# Patient Record
Sex: Male | Born: 1975 | Race: Black or African American | Hispanic: No | Marital: Single | Smoking: Current some day smoker
Health system: Southern US, Community
[De-identification: ages and names within clinical notes are randomized; demographics above are authoritative.]

## PROBLEM LIST (undated history)

## (undated) DIAGNOSIS — J45909 Unspecified asthma, uncomplicated: Secondary | ICD-10-CM

## (undated) DIAGNOSIS — M109 Gout, unspecified: Secondary | ICD-10-CM

## (undated) HISTORY — PX: NO PAST SURGERIES: SHX2092

---

## 2019-07-31 ENCOUNTER — Ambulatory Visit
Admission: EM | Admit: 2019-07-31 | Discharge: 2019-07-31 | Disposition: A | Discharge status: Home / Self Care | Payer: Self-pay | Attending: Family Medicine | Admitting: Family Medicine

## 2019-07-31 ENCOUNTER — Other Ambulatory Visit: Payer: Self-pay

## 2019-07-31 DIAGNOSIS — M25561 Pain in right knee: Secondary | ICD-10-CM

## 2019-07-31 HISTORY — DX: Unspecified asthma, uncomplicated: J45.909

## 2019-07-31 MED ORDER — PREDNISONE 10 MG PO TABS: ORAL_TABLET | ORAL | 0 refills | Status: DC

## 2019-07-31 MED ORDER — TRAMADOL HCL 50 MG PO TABS: 50.0000 mg | ORAL_TABLET | Freq: Three times a day (TID) | ORAL | 0 refills | Status: DC | PRN

## 2019-07-31 NOTE — Discharge Instructions (Signed)
Rest, ice, elevation.  Prednisone as prescribed.  Pain medication as needed.  Take care  Dr. Lacinda Axon

## 2019-07-31 NOTE — ED Triage Notes (Signed)
Patient complains of right knee pain that he thinks he might have "banged it" a few days. Patient states that the pain has increased as well as the swelling over last 2 days. Patient states that he has a history of gout.

## 2019-07-31 NOTE — ED Provider Notes (Signed)
[facility]    CSN: [id number] Arrival date & time: 07/31/19  1724  History   Chief Complaint Chief Complaint  Patient presents with  . Knee Pain    right   HPI  43 year old male presents with right knee pain.  2-day history of right knee pain.  He reports associated swelling.  He states that the swelling is quite extensive.  He states that his pain is 10/10 in severity.  He denies any recent fall, trauma, injury.  Patient reports that he has a history of gout.  He states that he has had gout in his knee before.  Pain is worse with activity and range of motion.  No relieving factors.  No known inciting factor.  No other reported symptoms.  No other complaints.  PMH, Surgical Hx, Family Hx, Social History reviewed and updated as below.  Past Medical History:  Diagnosis Date  . Asthma   Gout  Past Surgical History:  Procedure Laterality Date  . NO PAST SURGERIES     Home Medications    Prior to Admission medications   Medication Sig Start Date End Date Taking? Authorizing Provider  predniSONE (DELTASONE) 10 MG tablet 50 mg daily x 3 days, then 40 mg daily x 3 days, then 30 mg daily x 3 days, then 20 mg daily x 3 days, then 10 mg daily x 3 days. 07/31/19   Tommie Samsook, Verdie Wilms G, DO  traMADol (ULTRAM) 50 MG tablet Take 1 tablet (50 mg total) by mouth every 8 (eight) hours as needed. 07/31/19   Tommie Samsook, Skye Rodarte G, DO    Family History Family History  Problem Relation Age of Onset  . Diabetes Mother   . Heart disease Mother   . COPD Mother     Social History Social History   Tobacco Use  . Smoking status: Never Smoker  . Smokeless tobacco: Never Used  Substance Use Topics  . Alcohol use: Yes    Comment: rare  . Drug use: Never     Allergies   Patient has no known allergies.   Review of Systems Review of Systems  Constitutional: Negative.   Musculoskeletal:       Right knee pain, swelling.   Physical Exam Triage Vital Signs ED Triage Vitals  Enc Vitals  Group     BP 07/31/19 1756 111/85     Pulse Rate 07/31/19 1756 76     Resp 07/31/19 1756 16     Temp 07/31/19 1756 98.4 F (36.9 C)     Temp Source 07/31/19 1756 Oral     SpO2 07/31/19 1756 100 %     Weight 07/31/19 1753 210 lb (95.3 kg)     Height 07/31/19 1753 5\' 6"  (1.676 m)     Head Circumference --      Peak Flow --      Pain Score 07/31/19 1753 10     Pain Loc --      Pain Edu? --      Excl. in GC? --    Updated Vital Signs BP 111/85 (BP Location: Left Arm)   Pulse 76   Temp 98.4 F (36.9 C) (Oral)   Resp 16   Ht 5\' 6"  (1.676 m)   Wt 95.3 kg   SpO2 100%   BMI 33.89 kg/m   Visual Acuity Right Eye Distance:   Left Eye Distance:   Bilateral Distance:    Right Eye Near:   Left Eye Near:    Bilateral Near:  Physical Exam Vitals signs and nursing note reviewed.  Constitutional:      General: He is not in acute distress.    Appearance: Normal appearance. He is obese. He is not ill-appearing.  Eyes:     General:        Right eye: No discharge.        Left eye: No discharge.     Conjunctiva/sclera: Conjunctivae normal.  Cardiovascular:     Rate and Rhythm: Normal rate and regular rhythm.     Heart sounds: No murmur.  Pulmonary:     Effort: Pulmonary effort is normal.     Breath sounds: Normal breath sounds. No wheezing, rhonchi or rales.  Musculoskeletal:     Comments: Right knee: Effusion noted.  Diffuse, exquisite tenderness to palpation.  Mild warmth.  Neurological:     Mental Status: He is alert.  Psychiatric:        Mood and Affect: Mood normal.        Behavior: Behavior normal.    UC Treatments / Results  Labs (all labs ordered are listed, but only abnormal results are displayed) Labs Reviewed - No data to display  EKG   Radiology No results found.  Procedures Procedures (including critical care time)  Medications Ordered in UC Medications - No data to display  Initial Impression / Assessment and Plan / UC Course  I have reviewed  the triage vital signs and the nursing notes.  Pertinent labs & imaging results that were available during my care of the patient were reviewed by me and considered in my medical decision making (see chart for details).    43 year old male presents with acute right knee pain.  Suspect gout.  Treating with prednisone.  Tramadol as needed for pain.  Final Clinical Impressions(s) / UC Diagnoses   Final diagnoses:  Acute pain of right knee     Discharge Instructions     Rest, ice, elevation.  Prednisone as prescribed.  Pain medication as needed.  Take care  Dr. Lacinda Axon    ED Prescriptions    Medication Sig Dispense Auth. Provider   predniSONE (DELTASONE) 10 MG tablet 50 mg daily x 3 days, then 40 mg daily x 3 days, then 30 mg daily x 3 days, then 20 mg daily x 3 days, then 10 mg daily x 3 days. 45 tablet Tayjah Lobdell G, DO   traMADol (ULTRAM) 50 MG tablet Take 1 tablet (50 mg total) by mouth every 8 (eight) hours as needed. 10 tablet Coral Spikes, DO     Controlled Substance Prescriptions [place] Controlled Substance Registry consulted? Not Applicable   Coral Spikes, DO 07/31/19 2005

## 2019-12-05 ENCOUNTER — Ambulatory Visit (INDEPENDENT_AMBULATORY_CARE_PROVIDER_SITE_OTHER): Payer: Self-pay

## 2019-12-05 ENCOUNTER — Other Ambulatory Visit: Payer: Self-pay

## 2019-12-05 ENCOUNTER — Ambulatory Visit
Admission: EM | Admit: 2019-12-05 | Discharge: 2019-12-05 | Disposition: A | Discharge status: Home / Self Care | Payer: Self-pay | Attending: Urgent Care | Admitting: Urgent Care

## 2019-12-05 DIAGNOSIS — M79671 Pain in right foot: Secondary | ICD-10-CM

## 2019-12-05 DIAGNOSIS — L03115 Cellulitis of right lower limb: Secondary | ICD-10-CM

## 2019-12-05 DIAGNOSIS — M7989 Other specified soft tissue disorders: Secondary | ICD-10-CM

## 2019-12-05 HISTORY — DX: Gout, unspecified: M10.9

## 2019-12-05 MED ORDER — PREDNISONE 20 MG PO TABS: ORAL_TABLET | ORAL | 0 refills | Status: DC

## 2019-12-05 MED ORDER — DOXYCYCLINE HYCLATE 100 MG PO CAPS: 100.0000 mg | ORAL_CAPSULE | Freq: Two times a day (BID) | ORAL | 0 refills | Status: DC

## 2019-12-05 MED ORDER — TRAMADOL HCL 50 MG PO TABS: 50.0000 mg | ORAL_TABLET | Freq: Three times a day (TID) | ORAL | 0 refills | Status: DC | PRN

## 2019-12-05 NOTE — Discharge Instructions (Signed)
It was very nice seeing you today in clinic. Thank you for entrusting me with your care.   Rest, ice, and elevate your foot. Soak foot at least once a day in warm EPSON SALT water to help with swelling. May use ice if you feel like it helps. Monitor for signs and symptoms of worsening infection, which would include increased redness, swelling, streaking, drainage, pain, and the development of a fever. Take medications as prescribed. Call clinic if not improving.   Make arrangements to follow up with your regular doctor in 1 week for re-evaluation if not improving. If your symptoms/condition worsens, please seek follow up care either here or in the ER. Please remember, our [facility] providers are "right here with you" when you need [place].   Again, it was my pleasure to take care of you today. Thank you for choosing our clinic. I hope that you start to feel better quickly.   Quentin Mulling, MSN, APRN, FNP-C, CEN Advanced Practice Provider [facility]

## 2019-12-05 NOTE — ED Triage Notes (Addendum)
Pt presents with c/o edema to right foot. He states there is a red area on the outer area of his foot and he thinks he may have been bitten by an insect/spider. Pt states he noticed what could be a bite area about 3 days ago. The swelling has increased since then and pt is not able to ambulate on the foot. Area of interest is red and warm to the touch. Pt denies any fever/chills or n/v.

## 2019-12-06 ENCOUNTER — Encounter: Payer: Self-pay | Admitting: Urgent Care

## 2019-12-06 NOTE — ED Provider Notes (Signed)
[place]   Name: Nathaniel [name] DOB: 05-Aug-1976 MRN: [id number] CSN: [id number] PCP: Patient, No Pcp Per  Arrival date and time:  12/05/19 1606  Chief Complaint:  Foot Pain (right)   NOTE: Prior to seeing the patient today, I have reviewed the triage nursing documentation and vital signs. Clinical staff has updated patient's PMH/PSHx, current medication list, and drug allergies/intolerances to ensure comprehensive history available to assist in medical decision making.   History:   HPI: Dequann Vandervelden is a 44 y.o. male who presents today with complaints of pain and swelling to his RIGHT foot. Symptoms progressive over the course of the last 3 days. Pain overlying MTP joints of digits 3 through 5 and lateral aspect of foot. There is evidence of an insect bite to the lateral foot. Patient denies any drainage, fever, or chills. PMH (+) for gout and patient notes that this feels similar. With that being said, he is also concerned about a possible spider bite. Patient with ambulation difficulties. He states, "the swelling makes it feel like I am wake on Jello. When I put weight on it, the pain is so bad". Patient presents to clinic in a wheelchair. His mother drove patient to the clinic today. In efforts to conservatively manage his symptoms at home, the patient notes that he has used oral diphenhydramine, which has not helped to improve his symptoms.   Past Medical History:  Diagnosis Date  . Asthma   . Gout     Past Surgical History:  Procedure Laterality Date  . NO PAST SURGERIES      Family History  Problem Relation Age of Onset  . Diabetes Mother   . Heart disease Mother   . COPD Mother     Social History   Tobacco Use  . Smoking status: Never Smoker  . Smokeless tobacco: Never Used  Substance Use Topics  . Alcohol use: Yes    Comment: rare  . Drug use: Never    There are no problems to display for this patient.   Home Medications:    No outpatient  medications have been marked as taking for the 12/05/19 encounter [facility] Encounter).    Allergies:   Patient has no known allergies.  Review of Systems (ROS): Review of Systems  Constitutional: Negative for chills and fever.  Respiratory: Negative for cough and shortness of breath.   Cardiovascular: Negative for chest pain and palpitations.  Musculoskeletal: Positive for gait problem.       Acute pain and swelling to RIGHT foot.  Skin: Positive for color change. Negative for pallor, rash and wound.  Neurological: Negative for weakness and numbness.  All other systems reviewed and are negative.    Vital Signs: Today's Vitals   12/05/19 1700 12/05/19 1701 12/05/19 1703  BP:   100/65  Pulse:   83  Temp:   98.6 F (37 C)  TempSrc:   Oral  SpO2:   100%  Weight:  198 lb (89.8 kg)   Height:  5\' 6"  (1.676 m)   PainSc: 8       Physical Exam: Physical Exam  Constitutional: He is oriented to person, place, and time and well-developed, well-nourished, and in no distress.  HENT:  Head: Normocephalic and atraumatic.  Eyes: Pupils are equal, round, and reactive to light.  Cardiovascular: Normal rate, regular rhythm, normal heart sounds and intact distal pulses.  Pulmonary/Chest: Effort normal and breath sounds normal.  Musculoskeletal:     Right foot: Normal range  of motion. Swelling and tenderness present. No deformity or crepitus.       Feet:  Neurological: He is alert and oriented to person, place, and time. Gait normal.  Skin: Skin is warm and dry. No rash noted. He is not diaphoretic.  Psychiatric: Mood, memory, affect and judgment normal.  Nursing note and vitals reviewed.   Urgent Care Treatments / Results:   Orders Placed This Encounter  Procedures  . DG Foot Complete Right    LABS: PLEASE NOTE: all labs that were ordered this encounter are listed, however only abnormal results are displayed. Labs Reviewed - No data to display  EKG: -None  RADIOLOGY: DG  Foot Complete Right  Result Date: 12/05/2019 CLINICAL DATA:  Pain and swelling over the third through fifth metatarsal region of the foot. No trauma. Questionable insect bite or gout. EXAM: RIGHT FOOT COMPLETE - 3+ VIEW COMPARISON:  None. FINDINGS: No fracture or bone lesion. Joints are normally spaced and aligned.  No arthropathic changes. There is nonspecific dorsal forefoot soft tissue swelling. No radiopaque foreign body. No soft tissue air. IMPRESSION: 1. No fracture, bone lesion or joint abnormality. 2. Dorsal forefoot soft tissue swelling. Electronically Signed   By: Amie Portland M.D.   On: 12/05/2019 17:33    PROCEDURES: Procedures  MEDICATIONS RECEIVED THIS VISIT: Medications - No data to display  PERTINENT CLINICAL COURSE NOTES/UPDATES:   Initial Impression / Assessment and Plan / Urgent Care Course:  Pertinent labs & imaging results that were available during my care of the patient were personally reviewed by me and considered in my medical decision making (see lab/imaging section of note for values and interpretations).  Breyson Kelm is a 44 y.o. male who presents to [facility] today with complaints of Foot Pain (right)  Patient is well appearing overall in clinic today. He does not appear to be in any acute distress. Presenting symptoms (see HPI) and exam as documented above. Exam reveals pain, swelling, erythema, and warmth to the dorsal forefoot. Pain mainly overly 3rd - 5th MTP joints and lateral aspect of foot. There is a small puncture wound to the lateral aspect of the foot. Discussed differential diagnoses, which include insect/arthropod bite, penetrating wound from foreign body, and flare of patient's known gout. Discussed that gout less likely as it generally manifests in the great toe. Patient advises that this is similar to past presentations. Will obtain diagnostic plain films to assess for subcutaneous gas collection and intra/extra-articular crystals to  further assess.   Diagnostic radiographs of the RIGHT foot revealed no acute osseous abnormalities; no fracture or dislocation. No evidence of gas or crystals. (+) dorsal soft tissue swelling. Given his exam, concern for cellulitis discussed. Etiology of wound to lateral foot remains unknown. Bite vs. penetrating wound remains at the top of the DDx list. Will cover for infectious etiology using a 10 day course of doxycycline (100 mg BID). Patient to monitor for signs and symptoms of infection, which would include increased redness, swelling, streaking, drainage, pain, and the development of a fever.  Additionally, will send in a 5 day steroid burst (40 mg daily) to help with reduce inflammation. Patient encouraged to soak foot in warm Epson salt water. May apply ice as tolerated to help reduce swelling. May use Tylenol and/or Ibuprofen as needed for pain. Short course of Tramadol also sent in for severe pain; indications and side effects reviewed.   Discussed follow up with primary care physician in 1 week for re-evaluation. I  have reviewed the follow up and strict return precautions for any new or worsening symptoms. Patient is aware of symptoms that would be deemed urgent/emergent, and would thus require further evaluation either here or in the emergency department. At the time of discharge, he verbalized understanding and consent with the discharge plan as it was reviewed with him. All questions were fielded by provider and/or clinic staff prior to patient discharge.    Final Clinical Impressions / Urgent Care Diagnoses:   Final diagnoses:  Cellulitis of right foot  Foot pain, right  Foot swelling    New Prescriptions:  [place] Controlled Substance Registry consulted? Yes, I have consulted the [place] Controlled Substances Registry for this patient, and feel the risk/benefit ratio today is favorable for proceeding with this prescription for a controlled substance.  . Discussed use of controlled substance  medication to treat his acute pain.  o Reviewed [facility] STOP Act regulations  o Clinic does not refill controlled substances over the phone without face to face evaluation.  . Safety precautions reviewed.  o Medications should not be bitten, chewed, sold, or taken with alcohol.  o Avoid use while working, driving, or operating heavy machinery.  o Side effects associated with the use of this particular medication reviewed. - Patient understands that this medication can cause CNS depression, increase his risk of falls, and even lead to overdose that may result in death, if used outside of the parameters that he and I discussed.  With all of this in mind, he knowingly accepts the risks and responsibilities associated with intended course of treatment, and elects to responsibly proceed as discussed.  Meds ordered this encounter  Medications  . doxycycline (VIBRAMYCIN) 100 MG capsule    Sig: Take 1 capsule (100 mg total) by mouth 2 (two) times daily.    Dispense:  20 capsule    Refill:  0  . predniSONE (DELTASONE) 20 MG tablet    Sig: Take 40 mg (2 tabs) daily for next 5 days.    Dispense:  10 tablet    Refill:  0  . traMADol (ULTRAM) 50 MG tablet    Sig: Take 1 tablet (50 mg total) by mouth every 8 (eight) hours as needed.    Dispense:  12 tablet    Refill:  0    Recommended Follow up Care:  Patient encouraged to follow up with the following provider within the specified time frame, or sooner as dictated by the severity of his symptoms. As always, he was instructed that for any urgent/emergent care needs, he should seek care either here or in the emergency department for more immediate evaluation.  Follow-up Information    PCP In 1 week.   Why: General reassessment of symptoms if not improving        NOTE: This note was prepared using Lobbyist along with smaller Company secretary. Despite my best ability to proofread, there is the potential that transcriptional errors may  still occur from this process, and are completely unintentional.    Karen Kitchens, NP 12/06/19 2217

## 2020-02-20 ENCOUNTER — Other Ambulatory Visit
Admission: RE | Admit: 2020-02-20 | Discharge: 2020-02-20 | Disposition: A | Discharge status: Home / Self Care | Payer: Self-pay | Attending: Family Medicine | Admitting: Family Medicine

## 2020-02-20 ENCOUNTER — Emergency Department: Admission: EM | Admit: 2020-02-20 | Payer: Self-pay | Source: Home / Self Care

## 2020-02-20 NOTE — ED Triage Notes (Signed)
Pt arrived to Ed triage 3 accompanied by [employer] Officer Pride, for a forensic blood draw.  Warrant in place for blood draw.  Pt ambulated to triage 3, verbally consented to blood draw and voluntarily signed consent form.  Officers provided kit.  Tourniquit applied to left upper arm and site prepped with betadine swab provided in the forensic kit. 2 gray top tubes obtained, tournniquit removed, needle withdrawn intact.  Site dressed with sterile dressing and bleeding controlled.  Pt ambulated out of ED escorted by [employer].

## 2020-03-15 ENCOUNTER — Encounter: Payer: Self-pay | Admitting: Emergency Medicine

## 2020-03-15 ENCOUNTER — Ambulatory Visit (INDEPENDENT_AMBULATORY_CARE_PROVIDER_SITE_OTHER): Payer: Self-pay

## 2020-03-15 ENCOUNTER — Other Ambulatory Visit: Payer: Self-pay

## 2020-03-15 ENCOUNTER — Ambulatory Visit
Admission: EM | Admit: 2020-03-15 | Discharge: 2020-03-15 | Disposition: A | Discharge status: Home / Self Care | Payer: Self-pay | Attending: Emergency Medicine | Admitting: Emergency Medicine

## 2020-03-15 DIAGNOSIS — L03116 Cellulitis of left lower limb: Secondary | ICD-10-CM

## 2020-03-15 LAB — BASIC METABOLIC PANEL
Anion gap: 8 (ref 5–15)
BUN: 14 mg/dL (ref 6–20)
CO2: 26 mmol/L (ref 22–32)
Calcium: 8.5 mg/dL — ABNORMAL LOW (ref 8.9–10.3)
Chloride: 99 mmol/L (ref 98–111)
Creatinine, Ser: 0.84 mg/dL (ref 0.61–1.24)
GFR calc Af Amer: >60 mL/min (ref >60)
GFR calc non Af Amer: >60 mL/min (ref >60)
Glucose, Bld: 93 mg/dL (ref 70–99)
Potassium: 4.4 mmol/L (ref 3.5–5.1)
Sodium: 133 mmol/L — ABNORMAL LOW (ref 135–145)

## 2020-03-15 LAB — CBC WITH DIFFERENTIAL/PLATELET
Abs Immature Granulocytes: 0.01 10*3/uL (ref 0.00–0.07)
Basophils Absolute: 0.1 10*3/uL (ref 0.0–0.1)
Basophils Relative: 1 %
Eosinophils Absolute: 0.3 10*3/uL (ref 0.0–0.5)
Eosinophils Relative: 4 %
HCT: 37.1 % — ABNORMAL LOW (ref 39.0–52.0)
Hemoglobin: 11.8 g/dL — ABNORMAL LOW (ref 13.0–17.0)
Immature Granulocytes: 0 %
Lymphocytes Relative: 25 %
Lymphs Abs: 1.9 10*3/uL (ref 0.7–4.0)
MCH: 28.2 pg (ref 26.0–34.0)
MCHC: 31.8 g/dL (ref 30.0–36.0)
MCV: 88.5 fL (ref 80.0–100.0)
Monocytes Absolute: 0.7 10*3/uL (ref 0.1–1.0)
Monocytes Relative: 9 %
Neutro Abs: 4.6 10*3/uL (ref 1.7–7.7)
Neutrophils Relative %: 61 %
Platelets: 276 10*3/uL (ref 150–400)
RBC: 4.19 MIL/uL — ABNORMAL LOW (ref 4.22–5.81)
RDW: 14.2 % (ref 11.5–15.5)
WBC: 7.5 10*3/uL (ref 4.0–10.5)
nRBC: 0 % (ref 0.0–0.2)

## 2020-03-15 MED ORDER — DOXYCYCLINE HYCLATE 100 MG PO CAPS: 100.0000 mg | ORAL_CAPSULE | Freq: Two times a day (BID) | ORAL | 0 refills | Status: AC

## 2020-03-15 MED ORDER — IBUPROFEN 600 MG PO TABS: 600.0000 mg | ORAL_TABLET | Freq: Four times a day (QID) | ORAL | 0 refills | Status: DC | PRN

## 2020-03-15 MED ORDER — CEFTRIAXONE SODIUM 1 G IJ SOLR
1.0000 g | Freq: Once | INTRAMUSCULAR | Status: AC
  Administered 2020-03-15: 10:00:00 1 g via INTRAMUSCULAR

## 2020-03-15 NOTE — ED Triage Notes (Signed)
Pt c/o left leg and foot pain and swelling. Started about 2 days ago. He has redness on the top of his foot. He states the swelling is moving upward towards his knee. He also states that his right foot started yesterday.

## 2020-03-15 NOTE — Discharge Instructions (Addendum)
I have given you a shot of antibiotics today.  Finish the doxycycline, even if you feel better.  You may take ibuprofen 600 mg combined with the 1000 mg of Tylenol 3-4 times a day as needed for pain.  Your x-rays negative for fracture, there is no air underneath the skin.  Return here in 2 to 3 days if you are not getting any better.  Go immediately to the ER for fevers, body aches, if the redness spreads beyond the marks that we made today, if you are unable to move your ankle at all.  We then have to worry about an ankle joint infection, which an emergency.  Here is a list of primary care providers who are taking new patients:  Dr. Elizabeth Sauer, Dr. Schuyler Amor [address] [place] [address] [phone]  [facility] [address] [place] [address]  [phone]  [facility] [address] [place], [place] [address] [phone]  [facility] [address] [place]  [phone] [place], [place] [address]  Here are clinics/ other resources who will see you if you do not have insurance. Some have certain criteria that you must meet. Call them and find out what they are:  [facility]: [address], [place], [place] [address] Phone: [phone] Hours: First and Third Saturdays of each Month, 9 a.m. - 1 p.m.  [facility]: [address], [address] [place], [place] [address] Phone: [phone] Hours: Tuesday, 4 p.m. - 8 p.m. Thursday, 1 p.m. - 8 p.m. Wednesday, 9 a.m. - [facility] [address], [place], [place] [address] Phone: [phone] Pharmacy Phone Number: [phone] Dental Phone Number: [phone] [facility] Insurance Help: [phone]  Dental Hours: Monday - Thursday, 8 a.m. - 6 p.m.  [facility] [address], [place], [place] [address] Phone: [phone] Pharmacy Phone Number: [phone] [facility] Insurance Help: [phone]  [facility] [address] [place] [place], [place] [address] Phone: [phone] Pharmacy Phone Number: [phone] [facility] Insurance Help: [phone]  [facility] [address] [place], [place] [address] Phone: [phone] [facility] Insurance Help: [phone]   [facility] [address], [place], [place] [address] Phone: [phone]  Go to www.goodrx.com to look up your medications. This will give you a list of where you can find your prescriptions at the most affordable prices. Or ask the pharmacist what the cash price is, or if they have any other discount programs available to help make your medication more affordable. This can be less expensive than what you would pay with insurance.

## 2020-03-15 NOTE — ED Provider Notes (Signed)
HPI  SUBJECTIVE:  Nathaniel [name] is a 44 y.o. male who presents with left foot and ankle pain, swelling starting 3 days ago.  Reports swelling extending into his distal lower extremity.  He describes the pain as achy, sharp, present with weightbearing.  He reports erythema, increased temperature along his lateral left foot.  He denies change in his physical activity although he stands on his feet all day.  No trauma to the foot.  No body aches, fevers.  He reports increased temperature in the area of erythema, tingling.  No numbness.  No rash, leg pain.  It started distally and is spreading proximally.  He denies preceding paresthesias.  He tried ice, elevation, ibuprofen 200 mg every several hours without improvement of symptoms.  Symptoms are worse with holding his foot in a dependent position.  He has had similar symptoms before, was found to have a cellulitis, was sent home with doxycycline which helped.  He has a past medical history of gout and chickenpox.  No history of chronic kidney disease, diabetes, peripheral neuropathy, shingles, hypertension, peripheral arterial disease, peripheral vascular disease, MRSA.  PMD: None.  Past Medical History:  Diagnosis Date  . Asthma   . Gout     Past Surgical History:  Procedure Laterality Date  . NO PAST SURGERIES      Family History  Problem Relation Age of Onset  . Diabetes Mother   . Heart disease Mother   . COPD Mother     Social History   Tobacco Use  . Smoking status: Never Smoker  . Smokeless tobacco: Never Used  Substance Use Topics  . Alcohol use: Yes    Comment: rare  . Drug use: Never    No current facility-administered medications for this encounter.  Current Outpatient Medications:  .  albuterol (VENTOLIN HFA) 108 (90 Base) MCG/ACT inhaler, Inhale into the lungs every 6 (six) hours as needed for wheezing or shortness of breath., Disp: , Rfl:  .  doxycycline (VIBRAMYCIN) 100 MG capsule, Take 1 capsule (100 mg total)  by mouth 2 (two) times daily., Disp: 20 capsule, Rfl: 0 .  predniSONE (DELTASONE) 20 MG tablet, Take 40 mg (2 tabs) daily for next 5 days., Disp: 8 tablet, Rfl: 0 .  traMADol (ULTRAM) 50 MG tablet, Take 1 tablet (50 mg total) by mouth every 8 (eight) hours as needed., Disp: 12 tablet, Rfl: 0  No Known Allergies   ROS  As noted in HPI.   Physical Exam  BP 114/75 (BP Location: Left Arm)   Pulse 84   Temp 98.2 F (36.8 C) (Oral)   Resp 18   Ht 5\' 6"  (1.676 m)   Wt 89.8 kg   SpO2 100%   BMI 31.95 kg/m   Constitutional: Well developed, well nourished, no acute distress Eyes:  EOMI, conjunctiva normal bilaterally HENT: Normocephalic, atraumatic,mucus membranes moist Respiratory: Normal inspiratory effort Cardiovascular: Normal rate GI: nondistended skin: No rash, skin intact Musculoskeletal: Positive swelling, tenderness dorsum of left foot.  15 x 9 cm area of tender erythema with increased temperature and lateral left foot.  Skin intact.  Pain with range of motion of the ankle.  DP 2+.  Sensation intact. limitation of motion of the little toe.  Positive edema to distal lower extremity.  No tenderness over the ankle.              Neurologic: Alert & oriented x 3, no focal neuro deficits Psychiatric: Speech and behavior appropriate  ED Course  Medications  cefTRIAXone (ROCEPHIN) injection 1 g (1 g Intramuscular Given 03/15/20 0938)    Orders Placed This Encounter  Procedures  . DG Foot Complete Left    Standing Status:   Standing    Number of Occurrences:   1    Order Specific Question:   Reason for Exam (SYMPTOM  OR DIAGNOSIS REQUIRED)    Answer:   redness pain lateral foot r/o fx, subcu air  . CBC with Differential    Standing Status:   Standing    Number of Occurrences:   1  . Basic metabolic panel    Standing Status:   Standing    Number of Occurrences:   1    Results for orders placed or performed during the hospital encounter of 03/15/20 (from the past  24 hour(s))  CBC with Differential     Status: Abnormal   Collection Time: 03/15/20  9:41 AM  Result Value Ref Range   WBC 7.5 4.0 - 10.5 K/uL   RBC 4.19 (L) 4.22 - 5.81 MIL/uL   Hemoglobin 11.8 (L) 13.0 - 17.0 g/dL   HCT 57.3 (L) 22.0 - 25.4 %   MCV 88.5 80.0 - 100.0 fL   MCH 28.2 26.0 - 34.0 pg   MCHC 31.8 30.0 - 36.0 g/dL   RDW 27.0 62.3 - 76.2 %   Platelets 276 150 - 400 K/uL   nRBC 0.0 0.0 - 0.2 %   Neutrophils Relative % 61 %   Neutro Abs 4.6 1.7 - 7.7 K/uL   Lymphocytes Relative 25 %   Lymphs Abs 1.9 0.7 - 4.0 K/uL   Monocytes Relative 9 %   Monocytes Absolute 0.7 0.1 - 1.0 K/uL   Eosinophils Relative 4 %   Eosinophils Absolute 0.3 0.0 - 0.5 K/uL   Basophils Relative 1 %   Basophils Absolute 0.1 0.0 - 0.1 K/uL   Immature Granulocytes 0 %   Abs Immature Granulocytes 0.01 0.00 - 0.07 K/uL  Basic metabolic panel     Status: Abnormal   Collection Time: 03/15/20  9:41 AM  Result Value Ref Range   Sodium 133 (L) 135 - 145 mmol/L   Potassium 4.4 3.5 - 5.1 mmol/L   Chloride 99 98 - 111 mmol/L   CO2 26 22 - 32 mmol/L   Glucose, Bld 93 70 - 99 mg/dL   BUN 14 6 - 20 mg/dL   Creatinine, Ser 8.31 0.61 - 1.24 mg/dL   Calcium 8.5 (L) 8.9 - 10.3 mg/dL   GFR calc non Af Amer >60 >60 mL/min   GFR calc Af Amer >60 >60 mL/min   Anion gap 8 5 - 15   DG Foot Complete Left  Result Date: 03/15/2020 CLINICAL DATA:  Redness at lateral RIGHT foot extending dorsally for 2 days, pain, question gout EXAM: LEFT FOOT - COMPLETE 3+ VIEW COMPARISON:  None FINDINGS: Soft tissue swelling LEFT foot. Osseous mineralization normal. Joint spaces preserved. No fracture, dislocation, or bone destruction. No soft tissue gas or radiopaque foreign bodies. IMPRESSION: No acute osseous abnormalities. Soft tissue swelling LEFT foot. Electronically Signed   By: Ulyses Southward M.D.   On: 03/15/2020 09:52    ED Clinical Impression  1. Cellulitis of left lower extremity      ED Assessment/Plan  Presentation  consistent with a cellulitis.  Will x-ray foot to rule out subcutaneous air.  Giving him a gram of Rocephin to initiate antibiotic coverage.  Septic joint in the differential, however, he is able to  move his ankle and the area of erythema is well demarcated on his foot.  Marked area of erythema with marker for reference.  CBC, BMP, x-ray.  Reviewed imaging independently.  No fracture.  Positive soft tissue swelling.  No subcu air.  See radiology report for full details.  BMP unremarkable.  Normal kidney function.  No leukocytosis.  X-ray is negative for fracture or for subcutaneous air.  Will treat as a cellulitis with doxycycline 100 mg twice daily for 7 days, ibuprofen/Tylenol 3-4 times a day as needed for pain.  Will have him follow-up here in 2 to 3 days if not getting any better.  Strict ER return precautions.  Primary care list for ongoing care.  Discussed possibility of going to the emergency department with patient, patient wants to try outpatient treatment first.  He is to go immediately to the ER for body aches, fevers, or if the erythema extends out past the marks that we made today, if he is unable to move his ankle.  Discussed with him that this is a septic joint and that he will need to go to the hospital for this.  Discussed labs, imaging, MDM, treatment plan, and plan for follow-up with patient. Discussed sn/sx that should prompt return to the ED. patient agrees with plan.   Meds ordered this encounter  Medications  . cefTRIAXone (ROCEPHIN) injection 1 g    *This clinic note was created using Scientist, clinical (histocompatibility and immunogenetics). Therefore, there may be occasional mistakes despite careful proofreading.   ?   Domenick Gong, MD 03/15/20 1254

## 2020-03-17 ENCOUNTER — Ambulatory Visit
Admission: EM | Admit: 2020-03-17 | Discharge: 2020-03-17 | Disposition: A | Discharge status: Home / Self Care | Payer: Self-pay | Attending: Family Medicine | Admitting: Family Medicine

## 2020-03-17 ENCOUNTER — Other Ambulatory Visit: Payer: Self-pay

## 2020-03-17 DIAGNOSIS — M109 Gout, unspecified: Secondary | ICD-10-CM

## 2020-03-17 MED ORDER — PREDNISONE 10 MG PO TABS: ORAL_TABLET | ORAL | 0 refills | Status: DC

## 2020-03-17 MED ORDER — METHYLPREDNISOLONE SODIUM SUCC 40 MG IJ SOLR
80.0000 mg | Freq: Once | INTRAMUSCULAR | Status: AC
  Administered 2020-03-17: 10:00:00 80 mg via INTRAMUSCULAR

## 2020-03-17 NOTE — Discharge Instructions (Addendum)
Continue the antibiotic to ensure there is no infection.  Start the prednisone tomorrow.  Take care  Dr. Adriana Simas

## 2020-03-17 NOTE — ED Provider Notes (Signed)
[facility]    CSN: [id number] Arrival date & time: 03/17/20  0854   History   Chief Complaint Chief Complaint  Patient presents with  . Leg Pain   HPI  44 year old male presents with the above complaint.  Patient seen on 5/2.  X-ray obtained and patient was diagnosed with cellulitis.  Placed on doxycycline after he was given Rocephin injection here in the clinic.  Patient presents with worsening pain of his left lower extremity.  He also reports knee pain and swelling now.  He reports that he is having difficulty ambulating.  Patient has had little to no improvement.  He rates his pain as 10/10 in severity.  Has a history of gout.  No other reported symptoms.  No other complaints.  Past Medical History:  Diagnosis Date  . Asthma   . Gout    Past Surgical History:  Procedure Laterality Date  . NO PAST SURGERIES     Home Medications    Prior to Admission medications   Medication Sig Start Date End Date Taking? Authorizing Provider  albuterol (VENTOLIN HFA) 108 (90 Base) MCG/ACT inhaler Inhale into the lungs every 6 (six) hours as needed for wheezing or shortness of breath.   Yes [provider]  doxycycline (VIBRAMYCIN) 100 MG capsule Take 1 capsule (100 mg total) by mouth 2 (two) times daily for 7 days. 03/15/20 03/22/20 Yes Melynda Ripple, MD  ibuprofen (ADVIL) 600 MG tablet Take 1 tablet (600 mg total) by mouth every 6 (six) hours as needed. 03/15/20  Yes Melynda Ripple, MD  predniSONE (DELTASONE) 10 MG tablet 50 mg daily x 3 days, then 40 mg daily x 3 days, then 30 mg daily x 3 days, then 20 mg daily x 3 days, then 10 mg daily x 3 days. 03/17/20   Coral Spikes, DO    Family History Family History  Problem Relation Age of Onset  . Diabetes Mother   . Heart disease Mother   . COPD Mother     Social History Social History   Tobacco Use  . Smoking status: Never Smoker  . Smokeless tobacco: Never Used  Substance Use Topics  . Alcohol use: Yes   Comment: rare  . Drug use: Never     Allergies   Patient has no known allergies.   Review of Systems Review of Systems  Musculoskeletal:       Left foot swelling, redness, pain.  Left knee pain.   Physical Exam Triage Vital Signs ED Triage Vitals  Enc Vitals Group     BP 03/17/20 0914 (!) 135/99     Pulse Rate 03/17/20 0914 93     Resp 03/17/20 0914 18     Temp 03/17/20 0914 98.5 F (36.9 C)     Temp Source 03/17/20 0914 Oral     SpO2 03/17/20 0914 100 %     Weight 03/17/20 0913 196 lb 3.4 oz (89 kg)     Height 03/17/20 0913 5\' 6"  (1.676 m)     Head Circumference --      Peak Flow --      Pain Score 03/17/20 0913 10     Pain Loc --      Pain Edu? --      Excl. in [place]? --    Updated Vital Signs BP (!) 135/99 (BP Location: Left Arm)   Pulse 93   Temp 98.5 F (36.9 C) (Oral)   Resp 18   Ht 5'  6" (1.676 m)   Wt 89 kg   SpO2 100%   BMI 31.67 kg/m   Visual Acuity Right Eye Distance:   Left Eye Distance:   Bilateral Distance:    Right Eye Near:   Left Eye Near:    Bilateral Near:     Physical Exam Vitals and nursing note reviewed.  Constitutional:      General: He is not in acute distress.    Appearance: Normal appearance. He is not ill-appearing.  HENT:     Head: Normocephalic and atraumatic.  Eyes:     General:        Right eye: No discharge.        Left eye: No discharge.     Conjunctiva/sclera: Conjunctivae normal.  Pulmonary:     Effort: Pulmonary effort is normal. No respiratory distress.  Musculoskeletal:     Comments: Left foot - lateral swelling, erythema, and severe tenderness to palpation.  Left knee - posterior tenderness to palpation.  Neurological:     Mental Status: He is alert.  Psychiatric:        Mood and Affect: Mood normal.        Behavior: Behavior normal.    UC Treatments / Results  Labs (all labs ordered are listed, but only abnormal results are displayed) Labs Reviewed - No data to display  EKG   Radiology No  results found.  Procedures Procedures (including critical care time)  Medications Ordered in UC Medications  methylPREDNISolone sodium succinate (SOLU-MEDROL) 40 mg/mL injection 80 mg (80 mg Intramuscular Given 03/17/20 0953)    Initial Impression / Assessment and Plan / UC Course  I have reviewed the triage vital signs and the nursing notes.  Pertinent labs & imaging results that were available during my care of the patient were reviewed by me and considered in my medical decision making (see chart for details).    44 year old male presents with suspected gout.  Placing on prednisone.  Solu-Medrol given here.  Final Clinical Impressions(s) / UC Diagnoses   Final diagnoses:  Acute gout of multiple sites, unspecified cause     Discharge Instructions     Continue the antibiotic to ensure there is no infection.  Start the prednisone tomorrow.  Take care  Dr. Adriana Simas    ED Prescriptions    Medication Sig Dispense Auth. Provider   predniSONE (DELTASONE) 10 MG tablet 50 mg daily x 3 days, then 40 mg daily x 3 days, then 30 mg daily x 3 days, then 20 mg daily x 3 days, then 10 mg daily x 3 days. 45 tablet Tommie Sams, DO     PDMP not reviewed this encounter.   Tommie Sams, [place] 03/17/20 1039

## 2020-03-17 NOTE — ED Triage Notes (Signed)
Patient complains of left leg pain with knee swelling and foot swelling. Patient states that he was seen here on Sunday and some foot swelling has improved but he feels like the swelling is moving up his leg. Patient was placed on Doxycycline and ibuprofen. Patient states that he is just not sure why he isn't better.

## 2020-07-02 ENCOUNTER — Other Ambulatory Visit: Payer: Self-pay

## 2020-07-02 ENCOUNTER — Ambulatory Visit
Admission: EM | Admit: 2020-07-02 | Discharge: 2020-07-02 | Disposition: A | Discharge status: Home / Self Care | Payer: Self-pay | Attending: Physician Assistant | Admitting: Physician Assistant

## 2020-07-02 ENCOUNTER — Encounter: Payer: Self-pay | Admitting: Emergency Medicine

## 2020-07-02 DIAGNOSIS — M25561 Pain in right knee: Secondary | ICD-10-CM

## 2020-07-02 DIAGNOSIS — M109 Gout, unspecified: Secondary | ICD-10-CM

## 2020-07-02 MED ORDER — PREDNISONE 10 MG PO TABS: ORAL_TABLET | ORAL | 0 refills | Status: DC

## 2020-07-02 NOTE — Discharge Instructions (Signed)
-  Begin prednisone. Tylenol for pain relief. Rest, ice, elevate, compression/brace -Check with your insurance to find a PCP. You will need labs done and then hopefully you can get on a medication to prevent all of these flare ups. -F/u with our clinic as needed for new/worsening symptoms

## 2020-07-02 NOTE — ED Provider Notes (Signed)
[facility]    CSN: [id number] Arrival date & time: 07/02/20  0805      History   Chief Complaint Chief Complaint  Patient presents with  . Knee Pain  . Joint Swelling    HPI Nathaniel [name] is a 44 y.o. male.   44 y/o male presents for right knee pain. Denies known injury. Also admits to swelling. He says he noticed the pain and swelling starting yesterday. He has pain of the "lower part of the knee." He says pain is increased with flexion and weightbearing. He has severe pain in those cases. He says pain is improved with extension and rest. Has tried taking Motrin w/o relief. Patient says he has a history of gout. He presented to [facility] 3 months ago for a similar problem affecting the left ankle. He was given injection of decadron and prescribed prednisone. He says those medications provided a lot of relief for him. He says he had been treated for soft tissue infection in May with antibiotics but did not have improvement in condition until he started prednisone. Patient denies fever, fatigue, body aches, n/v, numbness/tingling/weakness. Patient denies history of DVT. Denies lower leg/calf swelling or pain. Patient does not have a PCP. He has no other concerns today.     Past Medical History:  Diagnosis Date  . Asthma   . Gout     There are no problems to display for this patient.   Past Surgical History:  Procedure Laterality Date  . NO PAST SURGERIES         Home Medications    Prior to Admission medications   Medication Sig Start Date End Date Taking? Authorizing Provider  albuterol (VENTOLIN HFA) 108 (90 Base) MCG/ACT inhaler Inhale into the lungs every 6 (six) hours as needed for wheezing or shortness of breath.    [provider]  ibuprofen (ADVIL) 600 MG tablet Take 1 tablet (600 mg total) by mouth every 6 (six) hours as needed. 03/15/20   Domenick Gong, MD  predniSONE (DELTASONE) 10 MG tablet Take 5 tabs PO x 3 days, 4 tabs  x 3 days, 3 tabs x 2 days, 2 tabs x 2 days, 1 tab x 2 days 07/02/20   Gareth Morgan    Family History Family History  Problem Relation Age of Onset  . Diabetes Mother   . Heart disease Mother   . COPD Mother     Social History Social History   Tobacco Use  . Smoking status: Never Smoker  . Smokeless tobacco: Never Used  Vaping Use  . Vaping Use: Never used  Substance Use Topics  . Alcohol use: Yes    Comment: rare  . Drug use: Never     Allergies   Patient has no known allergies.   Review of Systems Review of Systems  Constitutional: Negative for chills, fatigue and fever.  Respiratory: Negative for cough and shortness of breath.   Cardiovascular: Positive for leg swelling (knee swelling). Negative for chest pain.  Gastrointestinal: Negative for abdominal pain, nausea and vomiting.  Musculoskeletal: Positive for arthralgias, gait problem and joint swelling. Negative for back pain and myalgias.  Skin: Positive for color change. Negative for rash.  All other systems reviewed and are negative.    Physical Exam Triage Vital Signs ED Triage Vitals  Enc Vitals Group     BP 07/02/20 0827 133/84     Pulse Rate 07/02/20 0827 100     Resp 07/02/20  0827 18     Temp 07/02/20 0827 98.9 F (37.2 C)     Temp Source 07/02/20 0827 Oral     SpO2 07/02/20 0827 100 %     Weight 07/02/20 0826 196 lb 3.4 oz (89 kg)     Height 07/02/20 0826 5\' 6"  (1.676 m)     Head Circumference --      Peak Flow --      Pain Score 07/02/20 0825 10     Pain Loc --      Pain Edu? --      Excl. in [place]? --    No data found.  Updated Vital Signs BP 133/84 (BP Location: Right Arm)   Pulse 100   Temp 98.9 F (37.2 C) (Oral)   Resp 18   Ht 5\' 6"  (1.676 m)   Wt 196 lb 3.4 oz (89 kg)   SpO2 100%   BMI 31.67 kg/m        Physical Exam Vitals and nursing note reviewed.  Constitutional:      General: He is not in acute distress.    Appearance: Normal appearance. He is  well-developed. He is not ill-appearing or toxic-appearing.  HENT:     Head: Normocephalic and atraumatic.  Eyes:     General: No scleral icterus.    Extraocular Movements: Extraocular movements intact.     Conjunctiva/sclera: Conjunctivae normal.  Cardiovascular:     Rate and Rhythm: Normal rate and regular rhythm.     Pulses: Normal pulses.     Heart sounds: Normal heart sounds. No murmur heard.   Pulmonary:     Effort: Pulmonary effort is normal. No respiratory distress.     Breath sounds: Normal breath sounds.  Abdominal:     Tenderness: There is no abdominal tenderness.  Musculoskeletal:        General: No swelling, deformity or signs of injury.     Cervical back: Normal range of motion and neck supple.     Right knee: Swelling (moderate diffuse swelling anterior knee) and erythema (very faint erythema distal anterior knee) present. No bony tenderness. Decreased range of motion (decreased flexion beyond 90 degrees due to pain/swelling, full extension). Tenderness present over the medial joint line and lateral joint line.     Left knee: Normal. No bony tenderness.     Comments: 5/5 strength bilat knee flexion and extension.  Skin:    General: Skin is warm and dry.     Findings: No rash.  Neurological:     General: No focal deficit present.     Mental Status: He is alert. Mental status is at baseline.     Motor: No weakness.     Coordination: Coordination normal.     Gait: Gait abnormal.  Psychiatric:        Mood and Affect: Mood normal.        Behavior: Behavior normal.        Thought Content: Thought content normal.      UC Treatments / Results  Labs (all labs ordered are listed, but only abnormal results are displayed) Labs Reviewed - No data to display  EKG   Radiology No results found.  Procedures Procedures (including critical care time)  Medications Ordered in UC Medications - No data to display  Initial Impression / Assessment and Plan / UC Course    I have reviewed the triage vital signs and the nursing notes.  Pertinent labs & imaging results that were available during my  care of the patient were reviewed by me and considered in my medical decision making (see chart for details).   -Symptoms consistent with another acute gout flare up. Starting patient on prednisone. Reviewed RICE guidelines. Advised trying to find PCP to get on meds to help prevent these flare ups.  F/u for fever, increased redness/swelling/pain or any new/worsening symptoms. Pt agreeable.  Final Clinical Impressions(s) / UC Diagnoses   Final diagnoses:  Acute gout of right knee, unspecified cause  Acute pain of right knee     Discharge Instructions     -Begin prednisone. Tylenol for pain relief. Rest, ice, elevate, compression/brace -Check with your insurance to find a PCP. You will need labs done and then hopefully you can get on a medication to prevent all of these flare ups. -F/u with our clinic as needed for new/worsening symptoms    ED Prescriptions    Medication Sig Dispense Auth. Provider   predniSONE (DELTASONE) 10 MG tablet Take 5 tabs PO x 3 days, 4 tabs x 3 days, 3 tabs x 2 days, 2 tabs x 2 days, 1 tab x 2 days 39 tablet Shirlee Latch, PA-C     PDMP not reviewed this encounter.   Shirlee Latch, PA-C 07/02/20 [phone]

## 2020-07-02 NOTE — ED Triage Notes (Signed)
Patient c/o right knee pain and swelling that started yesterday. He denies injury.

## 2020-08-23 ENCOUNTER — Other Ambulatory Visit: Payer: Self-pay

## 2020-08-23 ENCOUNTER — Ambulatory Visit
Admission: EM | Admit: 2020-08-23 | Discharge: 2020-08-23 | Disposition: A | Discharge status: Home / Self Care | Payer: BC Managed Care – PPO | Attending: Emergency Medicine | Admitting: Emergency Medicine

## 2020-08-23 DIAGNOSIS — G8929 Other chronic pain: Secondary | ICD-10-CM

## 2020-08-23 DIAGNOSIS — M25561 Pain in right knee: Secondary | ICD-10-CM

## 2020-08-23 DIAGNOSIS — T23112A Burn of first degree of left thumb (nail), initial encounter: Secondary | ICD-10-CM

## 2020-08-23 DIAGNOSIS — M25562 Pain in left knee: Secondary | ICD-10-CM

## 2020-08-23 MED ORDER — MELOXICAM 7.5 MG PO TABS: 7.5000 mg | ORAL_TABLET | Freq: Every day | ORAL | 0 refills | Status: DC

## 2020-08-23 NOTE — ED Triage Notes (Signed)
Patient complains of left thumb pain that started 3 days ago after touching a hot skillet. States that he cannot lift with this hand.  Patient states that he has bilateral knee pain that is in the back of his knee that is worse with bending. States that this started over the last week. States that the pain is a tightness.

## 2020-08-23 NOTE — Discharge Instructions (Signed)
Keep your thumb wrapped with gauze and out of the air to ease the discomfort.  Take the Meloxicam for your thumb and knee pain daily.  Stop the Ibuprofen.  Continue to wear your braces.  Elevate your knees as much as possible.

## 2020-08-23 NOTE — ED Provider Notes (Signed)
[facility]    CSN: [id number] Arrival date & time: 08/23/20  1118      History   Chief Complaint Chief Complaint  Patient presents with   Hand Pain    left thumb    HPI Nathaniel [name] is a 44 y.o. male.   44 yo male here for evaluation of a burn to his left thumb x 3 days and pain in the back of both knees that he has been dealing with for years. The pain became worse last weekend and increases with bending.     Past Medical History:  Diagnosis Date   Asthma    Gout     There are no problems to display for this patient.   Past Surgical History:  Procedure Laterality Date   NO PAST SURGERIES         Home Medications    Prior to Admission medications   Medication Sig Start Date End Date Taking? Authorizing Provider  albuterol (VENTOLIN HFA) 108 (90 Base) MCG/ACT inhaler Inhale into the lungs every 6 (six) hours as needed for wheezing or shortness of breath.   Yes [provider]  ibuprofen (ADVIL) 600 MG tablet Take 1 tablet (600 mg total) by mouth every 6 (six) hours as needed. 03/15/20  Yes Domenick Gong, MD  meloxicam (MOBIC) 7.5 MG tablet Take 1 tablet (7.5 mg total) by mouth daily. 08/23/20   Becky Augusta, NP    Family History Family History  Problem Relation Age of Onset   Diabetes Mother    Heart disease Mother    COPD Mother     Social History Social History   Tobacco Use   Smoking status: Never Smoker   Smokeless tobacco: Never Used  Building services engineer Use: Never used  Substance Use Topics   Alcohol use: Yes    Comment: rare   Drug use: Never     Allergies   Patient has no known allergies.   Review of Systems Review of Systems  Constitutional: Negative for activity change, appetite change and fever.  HENT: Negative for congestion and rhinorrhea.   Respiratory: Negative for cough.   Cardiovascular: Negative for chest pain.  Gastrointestinal: Negative for vomiting.  Musculoskeletal:  Positive for arthralgias. Negative for joint swelling and myalgias.  Skin: Negative.   Neurological: Negative for weakness and numbness.  Hematological: Negative.   Psychiatric/Behavioral: Negative.      Physical Exam Triage Vital Signs ED Triage Vitals  Enc Vitals Group     BP 08/23/20 1135 (!) 131/93     Pulse Rate 08/23/20 1135 78     Resp 08/23/20 1135 14     Temp 08/23/20 1135 98.9 F (37.2 C)     Temp Source 08/23/20 1135 Oral     SpO2 08/23/20 1135 100 %     Weight 08/23/20 1134 194 lb (88 kg)     Height 08/23/20 1134 5\' 6"  (1.676 m)     Head Circumference --      Peak Flow --      Pain Score 08/23/20 1134 5     Pain Loc --      Pain Edu? --      Excl. in GC? --    No data found.  Updated Vital Signs BP (!) 131/93 (BP Location: Right Arm)    Pulse 78    Temp 98.9 F (37.2 C) (Oral)    Resp 14    Ht 5\' 6"  (1.676 m)  Wt 194 lb (88 kg)    SpO2 100%    BMI 31.31 kg/m   Visual Acuity Right Eye Distance:   Left Eye Distance:   Bilateral Distance:    Right Eye Near:   Left Eye Near:    Bilateral Near:     Physical Exam Vitals and nursing note reviewed.  Constitutional:      General: He is not in acute distress.    Appearance: Normal appearance. He is not toxic-appearing.  HENT:     Head: Normocephalic and atraumatic.  Eyes:     Extraocular Movements: Extraocular movements intact.     Conjunctiva/sclera: Conjunctivae normal.  Pulmonary:     Effort: Pulmonary effort is normal.  Musculoskeletal:        General: Tenderness present.     Cervical back: Normal range of motion and neck supple.     Comments: Tender in popliteal fossa and mild edema bilaterally. No tenderness in either joint line, no effusion, anterior and posterior drawer tests are negative as are valgus and varus stress.   Skin:    General: Skin is warm and dry.     Capillary Refill: Capillary refill takes less than 2 seconds.     Findings: Erythema present.     Comments: There is mild  erythema to the volar aspect of the left thumb with minor swelling at the IP joint. There is no blistering, patient has good ROM, cap refill < 2 seconds.   Neurological:     General: No focal deficit present.     Mental Status: He is alert and oriented to person, place, and time.     Sensory: No sensory deficit.     Motor: No weakness.  Psychiatric:        Mood and Affect: Mood normal.        Behavior: Behavior normal.        Thought Content: Thought content normal.        Judgment: Judgment normal.      UC Treatments / Results  Labs (all labs ordered are listed, but only abnormal results are displayed) Labs Reviewed - No data to display  EKG   Radiology No results found.  Procedures Procedures (including critical care time)  Medications Ordered in UC Medications - No data to display  Initial Impression / Assessment and Plan / UC Course  I have reviewed the triage vital signs and the nursing notes.  Pertinent labs & imaging results that were available during my care of the patient were reviewed by me and considered in my medical decision making (see chart for details).   Patient is here for evaluation of a 3 day old burn to his left thumb that is no blistered but is mildly red. He only has pain when he grips or moves his finger. No blistering.  Both knees have had off and on issues for years and has never seen Orthopedics. He wears braces and takes Ibuprofen which helps a little. He has intermittent popping when he bends down or squats. The pain will come and go and alternate L-R. PE findings consistent with Baker's cyst. Unable to obtain U/S today.  Will treat both with Meloxicam for pain and inflammation and refer patient to Orthopedics.   Final Clinical Impressions(s) / UC Diagnoses   Final diagnoses:  Chronic pain of both knees  First degree burn of thumb of left hand, initial encounter     Discharge Instructions     Keep your thumb wrapped with  gauze and out  of the air to ease the discomfort.  Take the Meloxicam for your thumb and knee pain daily.  Stop the Ibuprofen.  Continue to wear your braces.  Elevate your knees as much as possible.    ED Prescriptions    Medication Sig Dispense Auth. Provider   meloxicam (MOBIC) 7.5 MG tablet Take 1 tablet (7.5 mg total) by mouth daily. 30 tablet Becky Augusta, NP     PDMP not reviewed this encounter.   Becky Augusta, NP 08/23/20 1221

## 2020-10-26 ENCOUNTER — Encounter: Payer: Self-pay | Admitting: Emergency Medicine

## 2020-10-26 ENCOUNTER — Ambulatory Visit (INDEPENDENT_AMBULATORY_CARE_PROVIDER_SITE_OTHER): Payer: Self-pay

## 2020-10-26 ENCOUNTER — Other Ambulatory Visit: Payer: Self-pay

## 2020-10-26 ENCOUNTER — Ambulatory Visit
Admission: EM | Admit: 2020-10-26 | Discharge: 2020-10-26 | Disposition: A | Discharge status: Home / Self Care | Payer: BC Managed Care – PPO | Attending: Sports Medicine | Admitting: Sports Medicine

## 2020-10-26 DIAGNOSIS — R2242 Localized swelling, mass and lump, left lower limb: Secondary | ICD-10-CM | POA: Insufficient documentation

## 2020-10-26 DIAGNOSIS — M79672 Pain in left foot: Secondary | ICD-10-CM

## 2020-10-26 DIAGNOSIS — M10072 Idiopathic gout, left ankle and foot: Secondary | ICD-10-CM | POA: Insufficient documentation

## 2020-10-26 DIAGNOSIS — M7989 Other specified soft tissue disorders: Secondary | ICD-10-CM

## 2020-10-26 LAB — COMPREHENSIVE METABOLIC PANEL
ALT: 13 U/L (ref 0–44)
AST: 14 U/L — ABNORMAL LOW (ref 15–41)
Albumin: 4.1 g/dL (ref 3.5–5.0)
Alkaline Phosphatase: 49 U/L (ref 38–126)
Anion gap: 14 (ref 5–15)
BUN: 21 mg/dL — ABNORMAL HIGH (ref 6–20)
CO2: 24 mmol/L (ref 22–32)
Calcium: 8.6 mg/dL — ABNORMAL LOW (ref 8.9–10.3)
Chloride: 100 mmol/L (ref 98–111)
Creatinine, Ser: 1.01 mg/dL (ref 0.61–1.24)
GFR, Estimated: >60 mL/min (ref >60)
Glucose, Bld: 92 mg/dL (ref 70–99)
Potassium: 3.7 mmol/L (ref 3.5–5.1)
Sodium: 138 mmol/L (ref 135–145)
Total Bilirubin: 0.5 mg/dL (ref 0.3–1.2)
Total Protein: 7.8 g/dL (ref 6.5–8.1)

## 2020-10-26 LAB — CBC WITH DIFFERENTIAL/PLATELET
Abs Immature Granulocytes: 0.02 10*3/uL (ref 0.00–0.07)
Basophils Absolute: 0.1 10*3/uL (ref 0.0–0.1)
Basophils Relative: 1 %
Eosinophils Absolute: 0.1 10*3/uL (ref 0.0–0.5)
Eosinophils Relative: 1 %
HCT: 40.1 % (ref 39.0–52.0)
Hemoglobin: 13 g/dL (ref 13.0–17.0)
Immature Granulocytes: 0 %
Lymphocytes Relative: 36 %
Lymphs Abs: 2.9 10*3/uL (ref 0.7–4.0)
MCH: 28.6 pg (ref 26.0–34.0)
MCHC: 32.4 g/dL (ref 30.0–36.0)
MCV: 88.1 fL (ref 80.0–100.0)
Monocytes Absolute: 0.7 10*3/uL (ref 0.1–1.0)
Monocytes Relative: 8 %
Neutro Abs: 4.3 10*3/uL (ref 1.7–7.7)
Neutrophils Relative %: 54 %
Platelets: 390 10*3/uL (ref 150–400)
RBC: 4.55 MIL/uL (ref 4.22–5.81)
RDW: 15.3 % (ref 11.5–15.5)
WBC: 8 10*3/uL (ref 4.0–10.5)
nRBC: 0 % (ref 0.0–0.2)

## 2020-10-26 LAB — SEDIMENTATION RATE: Sed Rate: 5 mm/hr (ref 0–15)

## 2020-10-26 LAB — URIC ACID: Uric Acid, Serum: 10 mg/dL — ABNORMAL HIGH (ref 3.7–8.6)

## 2020-10-26 LAB — C-REACTIVE PROTEIN: CRP: 1.6 mg/dL — ABNORMAL HIGH (ref <1.0)

## 2020-10-26 MED ORDER — COLCHICINE 0.6 MG PO TABS: 0.6000 mg | ORAL_TABLET | Freq: Two times a day (BID) | ORAL | 0 refills | Status: DC

## 2020-10-26 MED ORDER — METHYLPREDNISOLONE SODIUM SUCC 125 MG IJ SOLR
125.0000 mg | Freq: Once | INTRAMUSCULAR | Status: AC
  Administered 2020-10-26: 125 mg via INTRAMUSCULAR

## 2020-10-26 NOTE — ED Provider Notes (Signed)
[facility]    CSN: [id number] Arrival date & time: 10/26/20  0816      History   Chief Complaint Chief Complaint  Patient presents with  . Foot Pain    HPI Nathaniel [name] is a 44 y.o. male.   44 yo male presents with 2 days of left foot pain across the top of the foot with associated swelling.  No trauma.  Points to the 1st MTP joint and across the 2-4 metatarsals.  Has a history of gout per the chart.  No PCP, never prescribed alopruinol.  No fevers, shakes, chills.  No URI symptoms.  Says "pain is increased in left foot when I take a deep breath".  No red flag signs or symptoms.  Has pain with ambulation and is limping.  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  Scheduled to work today and tomorrow.     Past Medical History:  Diagnosis Date  . Asthma   . Gout     There are no problems to display for this patient.   Past Surgical History:  Procedure Laterality Date  . NO PAST SURGERIES         Home Medications    Prior to Admission medications   Medication Sig Start Date End Date Taking? Authorizing Provider  ibuprofen (ADVIL) 600 MG tablet Take 1 tablet (600 mg total) by mouth every 6 (six) hours as needed. 03/15/20  Yes Domenick Gong, MD  albuterol (VENTOLIN HFA) 108 (90 Base) MCG/ACT inhaler Inhale into the lungs every 6 (six) hours as needed for wheezing or shortness of breath.    [provider]  colchicine 0.6 MG tablet Take 1 tablet (0.6 mg total) by mouth 2 (two) times daily. Hold for loose stools or diarrhea.  Stop when symptoms resolve. 10/26/20   Delton See, MD  meloxicam (MOBIC) 7.5 MG tablet Take 1 tablet (7.5 mg total) by mouth daily. 08/23/20   Becky Augusta, NP    Family History Family History  Problem Relation Age of Onset  . Diabetes Mother   . Heart disease Mother   . COPD Mother     Social History Social History   Tobacco Use  . Smoking status: Never Smoker  . Smokeless tobacco: Never Used  Vaping Use   . Vaping Use: Never used  Substance Use Topics  . Alcohol use: Yes    Comment: rare  . Drug use: Never     Allergies   Patient has no known allergies.   Review of Systems Review of Systems  Constitutional: Positive for activity change. Negative for chills, diaphoresis and fever.  HENT: Negative.   Eyes: Negative.   Respiratory: Negative.   Cardiovascular: Negative.   Gastrointestinal: Negative.   Endocrine: Negative.   Genitourinary: Negative.   Musculoskeletal: Positive for arthralgias, gait problem and joint swelling. Negative for back pain, myalgias, neck pain and neck stiffness.  Skin: Negative for color change, pallor, rash and wound.  Allergic/Immunologic: Negative.   Hematological: Negative.   Psychiatric/Behavioral: Negative.   All other systems reviewed and are negative.    Physical Exam Triage Vital Signs ED Triage Vitals  Enc Vitals Group     BP 10/26/20 0829 (!) 139/91     Pulse Rate 10/26/20 0829 87     Resp 10/26/20 0829 18     Temp 10/26/20 0829 98 F (36.7 C)     Temp Source 10/26/20 0829 Oral     SpO2 10/26/20 0829 100 %  Weight 10/26/20 0828 198 lb (89.8 kg)     Height 10/26/20 0828 5\' 6"  (1.676 m)     Head Circumference --      Peak Flow --      Pain Score 10/26/20 0828 2     Pain Loc --      Pain Edu? --      Excl. in GC? --    No data found.  Updated Vital Signs BP (!) 139/91 (BP Location: Right Arm)   Pulse 87   Temp 98 F (36.7 C) (Oral)   Resp 18   Ht 5\' 6"  (1.676 m)   Wt 89.8 kg   SpO2 100%   BMI 31.96 kg/m   Visual Acuity Right Eye Distance:   Left Eye Distance:   Bilateral Distance:    Right Eye Near:   Left Eye Near:    Bilateral Near:     Physical Exam Constitutional:      General: He is not in acute distress.    Appearance: Normal appearance. He is not ill-appearing or toxic-appearing.  Cardiovascular:     Pulses: Normal pulses.     Heart sounds: Normal heart sounds.  Pulmonary:     Effort: Pulmonary  effort is normal. No respiratory distress.     Breath sounds: Normal breath sounds. No stridor. No wheezing, rhonchi or rales.  Chest:     Chest wall: No tenderness.  Musculoskeletal:     Comments: Right foot - wnl Left foot - no erythema.  Mild swelling on the top of the foot.  TTP at 1st MTP, and over the 2-3 metatarsals. Mild decreased ROM secondary to pain.  Unable to walk on toes. + antalgic gait. 2+ pulses, neurovasc intact  Neurological:     Mental Status: He is alert.      UC Treatments / Results  Labs (all labs ordered are listed, but only abnormal results are displayed) Labs Reviewed  COMPREHENSIVE METABOLIC PANEL - Abnormal; Notable for the following components:      Result Value   BUN 21 (*)    Calcium 8.6 (*)    AST 14 (*)    All other components within normal limits  URIC ACID - Abnormal; Notable for the following components:   Uric Acid, Serum 10.0 (*)    All other components within normal limits  CBC WITH DIFFERENTIAL/PLATELET  SEDIMENTATION RATE  C-REACTIVE PROTEIN    EKG   Radiology DG Foot Complete Left  Result Date: 10/26/2020 CLINICAL DATA:  44 year old male with 2 days of left foot pain and swelling with no known injury. Symptoms maximal at the 1st MCP joint. EXAM: LEFT FOOT - COMPLETE 3+ VIEW COMPARISON:  Left foot series 03/15/2020. FINDINGS: Background bone mineralization remains normal. There is a degree of chronic pes planus. No acute osseous abnormality identified. Mild subchondral sclerosis and osteophytosis at the 1st MTP joint. Regressed soft tissue swelling in the left foot compared to May. No discrete soft tissue abnormality. IMPRESSION: No acute osseous abnormality identified. 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 Electronically Signed   By: 05/15/2020 M.D.   On: 10/26/2020 09:17    Procedures Procedures (including critical care time)  Medications Ordered in UC Medications  methylPREDNISolone sodium succinate (SOLU-MEDROL) 125 mg/2 mL injection  125 mg (125 mg Intramuscular Given 10/26/20 0956)    Initial Impression / Assessment and Plan / UC Course  I have reviewed the triage vital signs and the nursing notes.  Pertinent labs & imaging results that were  available during my care of the patient were reviewed by me and considered in my medical decision making (see chart for details).      Final Clinical Impressions(s) / UC Diagnoses   Final diagnoses:  Foot pain, left  Acute idiopathic gout of left foot  Localized swelling of left foot     Discharge Instructions     You have gout -- your uric acid level is elevated. Stop Ibuprofen.  Take Colchicine as prescribed.  Need to establish care with PCP -- will need allopurinol once the acute flare has resolved.    ED Prescriptions    Medication Sig Dispense Auth. Provider   colchicine 0.6 MG tablet Take 1 tablet (0.6 mg total) by mouth 2 (two) times daily. Hold for loose stools or diarrhea.  Stop when symptoms resolve. 10 tablet Delton See, MD     PDMP not reviewed this encounter.   Delton See, MD 10/26/20 [phone]

## 2020-10-26 NOTE — Discharge Instructions (Addendum)
You have gout -- your uric acid level is elevated. Stop Ibuprofen.  Take Colchicine as prescribed.  Need to establish care with PCP -- will need allopurinol once the acute flare has resolved.

## 2020-10-26 NOTE — ED Triage Notes (Signed)
Patient c/o left foot pain that started 2 days ago. Denies injury.

## 2020-11-17 ENCOUNTER — Other Ambulatory Visit: Payer: Self-pay

## 2020-11-17 ENCOUNTER — Ambulatory Visit
Admission: EM | Admit: 2020-11-17 | Discharge: 2020-11-17 | Disposition: A | Discharge status: Home / Self Care | Payer: Self-pay | Attending: Family Medicine | Admitting: Family Medicine

## 2020-11-17 DIAGNOSIS — M109 Gout, unspecified: Secondary | ICD-10-CM

## 2020-11-17 MED ORDER — ALLOPURINOL 100 MG PO TABS: 100.0000 mg | ORAL_TABLET | Freq: Every day | ORAL | 1 refills | Status: DC

## 2020-11-17 MED ORDER — COLCHICINE 0.6 MG PO TABS: 0.6000 mg | ORAL_TABLET | Freq: Every day | ORAL | 1 refills | Status: DC

## 2020-11-17 MED ORDER — PREDNISONE 10 MG PO TABS: ORAL_TABLET | ORAL | 0 refills | Status: DC

## 2020-11-17 NOTE — ED Triage Notes (Signed)
Pt reports having R ankle pain and swelling that began yesterday. No known injury. Hx of gout.

## 2020-11-17 NOTE — ED Provider Notes (Signed)
[facility]    CSN: [id number] Arrival date & time: 11/17/20  1553      History   Chief Complaint Chief Complaint  Patient presents with  . Ankle Pain   HPI  45 year old [name] presents with the above complaint.  Patient has known history of gout.  Uncontrolled.  Patient reports he began to have right ankle pain and swelling yesterday.  Severe.  Pain is 10/10 in severity.  No relieving factors.  Prednisone has worked well for him in the past regarding gout.  Colchicine has not worked well.  No fall, trauma, injury.  No other complaints this time.  Past Medical History:  Diagnosis Date  . Asthma   . Gout    Past Surgical History:  Procedure Laterality Date  . NO PAST SURGERIES     Home Medications    Prior to Admission medications   Medication Sig Start Date End Date Taking? Authorizing Provider  allopurinol (ZYLOPRIM) 100 MG tablet Take 1 tablet (100 mg total) by mouth daily. 11/17/20  Yes Nathaniel [name] G, DO  predniSONE (DELTASONE) 10 MG tablet 50 mg daily x 3 days, then 40 mg daily x 3 days, then 30 mg daily x 3 days, then 20 mg daily x 3 days, then 10 mg daily x 3 days. 11/17/20  Yes Bryen Hinderman G, DO  albuterol (VENTOLIN HFA) 108 (90 Base) MCG/ACT inhaler Inhale into the lungs every 6 (six) hours as needed for wheezing or shortness of breath.    [provider]  colchicine 0.6 MG tablet Take 1 tablet (0.6 mg total) by mouth daily. 11/17/20   Nathaniel Sams, DO  ibuprofen (ADVIL) 600 MG tablet Take 1 tablet (600 mg total) by mouth every 6 (six) hours as needed. 03/15/20   Nathaniel Gong, MD  meloxicam (MOBIC) 7.5 MG tablet Take 1 tablet (7.5 mg total) by mouth daily. 08/23/20   Nathaniel Augusta, NP    Family History Family History  Problem Relation Age of Onset  . Diabetes Mother   . Heart disease Mother   . COPD Mother     Social History Social History   Tobacco Use  . Smoking status: Never Smoker  . Smokeless tobacco: Never Used  Vaping Use  .  Vaping Use: Never used  Substance Use Topics  . Alcohol use: Yes    Comment: rare  . Drug use: Never     Allergies   Patient has no known allergies.   Review of Systems Review of Systems  Constitutional: Negative.   Musculoskeletal:       Right ankle pain, redness, swelling.   Physical Exam Triage Vital Signs ED Triage Vitals  Enc Vitals Group     BP 11/17/20 1722 (!) 137/98     Pulse Rate 11/17/20 1722 91     Resp 11/17/20 1722 16     Temp 11/17/20 1722 98.1 F (36.7 C)     Temp Source 11/17/20 1722 Oral     SpO2 11/17/20 1722 100 %     Weight 11/17/20 1723 200 lb (90.7 kg)     Height 11/17/20 1723 5\' 6"  (1.676 m)     Head Circumference --      Peak Flow --      Pain Score 11/17/20 1722 10     Pain Loc --      Pain Edu? --      Excl. in GC? --    Updated Vital Signs BP (!) 137/98   Pulse  91   Temp 98.1 F (36.7 C) (Oral)   Resp 16   Ht 5\' 6"  (1.676 m)   Wt 90.7 kg   SpO2 100%   BMI 32.28 kg/m   Visual Acuity Right Eye Distance:   Left Eye Distance:   Bilateral Distance:    Right Eye Near:   Left Eye Near:    Bilateral Near:     Physical Exam Vitals and nursing note reviewed.  Constitutional:      General: He is not in acute distress.    Appearance: Normal appearance. He is not ill-appearing.  HENT:     Head: Normocephalic and atraumatic.  Pulmonary:     Effort: Pulmonary effort is normal. No respiratory distress.  Musculoskeletal:     Comments: Patient with exquisite tenderness on the lateral aspect of the right ankle.  Associated swelling and erythema.  Neurological:     Mental Status: He is alert.  Psychiatric:        Mood and Affect: Mood normal.        Behavior: Behavior normal.    UC Treatments / Results  Labs (all labs ordered are listed, but only abnormal results are displayed) Labs Reviewed - No data to display  EKG   Radiology No results found.  Procedures Procedures (including critical care time)  Medications  Ordered in UC Medications - No data to display  Initial Impression / Assessment and Plan / UC Course  I have reviewed the triage vital signs and the nursing notes.  Pertinent labs & imaging results that were available during my care of the patient were reviewed by me and considered in my medical decision making (see chart for details).    45 year old [name] presents with acute on chronic gout. Treating with prednisone.  After prednisone taper is done, he can start allopurinol and colchicine.   Final Clinical Impressions(s) / UC Diagnoses   Final diagnoses:  Acute gout of right ankle, unspecified cause   Discharge Instructions   None    ED Prescriptions    Medication Sig Dispense Auth. Provider   predniSONE (DELTASONE) 10 MG tablet 50 mg daily x 3 days, then 40 mg daily x 3 days, then 30 mg daily x 3 days, then 20 mg daily x 3 days, then 10 mg daily x 3 days. 45 tablet Danzell Birky G, DO   colchicine 0.6 MG tablet  (Status: Discontinued) Take 1 tablet (0.6 mg total) by mouth daily. 90 tablet Lillianna Sabel G, DO   allopurinol (ZYLOPRIM) 100 MG tablet Take 1 tablet (100 mg total) by mouth daily. 90 tablet Calin Ellery G, DO   colchicine 0.6 MG tablet Take 1 tablet (0.6 mg total) by mouth daily. 90 tablet 12-19-2003 G, DO     PDMP not reviewed this encounter.   Everlene Other, Nathaniel [name] 11/17/20 2109

## 2020-12-18 ENCOUNTER — Ambulatory Visit
Admission: RE | Admit: 2020-12-18 | Discharge: 2020-12-18 | Disposition: A | Discharge status: Home / Self Care | Payer: Self-pay | Source: Ambulatory Visit | Attending: Sports Medicine | Admitting: Sports Medicine

## 2020-12-18 ENCOUNTER — Other Ambulatory Visit: Payer: Self-pay

## 2020-12-18 ENCOUNTER — Ambulatory Visit
Admission: EM | Admit: 2020-12-18 | Discharge: 2020-12-18 | Disposition: A | Discharge status: Home / Self Care | Payer: Self-pay | Attending: Sports Medicine | Admitting: Sports Medicine

## 2020-12-18 ENCOUNTER — Encounter: Payer: Self-pay | Admitting: Emergency Medicine

## 2020-12-18 ENCOUNTER — Emergency Department
Admission: EM | Admit: 2020-12-18 | Discharge: 2020-12-18 | Disposition: A | Discharge status: Home / Self Care | Payer: Self-pay | Attending: Emergency Medicine | Admitting: Emergency Medicine

## 2020-12-18 DIAGNOSIS — R197 Diarrhea, unspecified: Secondary | ICD-10-CM | POA: Insufficient documentation

## 2020-12-18 DIAGNOSIS — K760 Fatty (change of) liver, not elsewhere classified: Secondary | ICD-10-CM | POA: Insufficient documentation

## 2020-12-18 DIAGNOSIS — Z5321 Procedure and treatment not carried out due to patient leaving prior to being seen by health care provider: Secondary | ICD-10-CM | POA: Insufficient documentation

## 2020-12-18 DIAGNOSIS — K8020 Calculus of gallbladder without cholecystitis without obstruction: Secondary | ICD-10-CM | POA: Insufficient documentation

## 2020-12-18 DIAGNOSIS — R1011 Right upper quadrant pain: Secondary | ICD-10-CM

## 2020-12-18 DIAGNOSIS — R1110 Vomiting, unspecified: Secondary | ICD-10-CM | POA: Insufficient documentation

## 2020-12-18 DIAGNOSIS — R112 Nausea with vomiting, unspecified: Secondary | ICD-10-CM

## 2020-12-18 DIAGNOSIS — R1013 Epigastric pain: Secondary | ICD-10-CM | POA: Insufficient documentation

## 2020-12-18 LAB — COMPREHENSIVE METABOLIC PANEL
ALT: 20 U/L (ref 0–44)
AST: 27 U/L (ref 15–41)
Albumin: 4.4 g/dL (ref 3.5–5.0)
Alkaline Phosphatase: 37 U/L — ABNORMAL LOW (ref 38–126)
Anion gap: 16 — ABNORMAL HIGH (ref 5–15)
BUN: 13 mg/dL (ref 6–20)
CO2: 23 mmol/L (ref 22–32)
Calcium: 9.1 mg/dL (ref 8.9–10.3)
Chloride: 96 mmol/L — ABNORMAL LOW (ref 98–111)
Creatinine, Ser: 0.96 mg/dL (ref 0.61–1.24)
GFR, Estimated: >60 mL/min (ref >60)
Glucose, Bld: 121 mg/dL — ABNORMAL HIGH (ref 70–99)
Potassium: 4.1 mmol/L (ref 3.5–5.1)
Sodium: 135 mmol/L (ref 135–145)
Total Bilirubin: 0.6 mg/dL (ref 0.3–1.2)
Total Protein: 8.2 g/dL — ABNORMAL HIGH (ref 6.5–8.1)

## 2020-12-18 LAB — CBC WITH DIFFERENTIAL/PLATELET
Abs Immature Granulocytes: 0.02 10*3/uL (ref 0.00–0.07)
Basophils Absolute: 0 10*3/uL (ref 0.0–0.1)
Basophils Relative: 1 %
Eosinophils Absolute: 0 10*3/uL (ref 0.0–0.5)
Eosinophils Relative: 0 %
HCT: 41.2 % (ref 39.0–52.0)
Hemoglobin: 13.7 g/dL (ref 13.0–17.0)
Immature Granulocytes: 0 %
Lymphocytes Relative: 18 %
Lymphs Abs: 1.2 10*3/uL (ref 0.7–4.0)
MCH: 28.4 pg (ref 26.0–34.0)
MCHC: 33.3 g/dL (ref 30.0–36.0)
MCV: 85.5 fL (ref 80.0–100.0)
Monocytes Absolute: 0.3 10*3/uL (ref 0.1–1.0)
Monocytes Relative: 4 %
Neutro Abs: 5.4 10*3/uL (ref 1.7–7.7)
Neutrophils Relative %: 77 %
Platelets: 316 10*3/uL (ref 150–400)
RBC: 4.82 MIL/uL (ref 4.22–5.81)
RDW: 14.2 % (ref 11.5–15.5)
WBC: 7 10*3/uL (ref 4.0–10.5)
nRBC: 0 % (ref 0.0–0.2)

## 2020-12-18 LAB — URINALYSIS, COMPLETE (UACMP) WITH MICROSCOPIC
Bilirubin Urine: NEGATIVE
Glucose, UA: NEGATIVE mg/dL
Ketones, ur: NEGATIVE mg/dL
Leukocytes,Ua: NEGATIVE
Nitrite: NEGATIVE
Protein, ur: NEGATIVE mg/dL
Specific Gravity, Urine: 1.025 (ref 1.005–1.030)
Squamous Epithelial / HPF: NONE SEEN (ref 0–5)
pH: 6.5 (ref 5.0–8.0)

## 2020-12-18 LAB — AMYLASE: Amylase: 80 U/L (ref 28–100)

## 2020-12-18 LAB — LIPASE, BLOOD: Lipase: 25 U/L (ref 11–51)

## 2020-12-18 NOTE — ED Triage Notes (Signed)
Pt to ED via POV from [facility]. Pt states that he woke up this morning around 0245 with epigastric abdominal. Pt states that he had labs and urine done at urgent care and they were normal. Pt reports that he had 2 episodes of vomiting as well as diarrhea. Pt is in NAD.

## 2020-12-18 NOTE — ED Provider Notes (Signed)
[facility]    CSN: [id number] Arrival date & time: 12/18/20  3212      History   Chief Complaint Chief Complaint  Patient presents with  . Abdominal Pain    HPI Nathaniel [name] is a 45 y.o. male.   Patient pleasant 45 year old male who presents for evaluation of the above issues.  He reports he woke up at 3 AM with upper abdominal pain.  He reports that its in the right upper quadrant but also into the epigastric and left upper quadrant.  He did not eat out last night.  He just had dinner at home and everyone else who ate is not symptomatic.  He has had some nausea and loose stools.  There is no blood in the stools.  He is also had emesis x2.  He reports that he tried some cranberry juice and he threw that up both times.  It was shortly after he took in the juice.  No blood or bile in the emesis.  He has been vaccinated against COVID.  No booster shot.  No flu shot.  He denies any Covid symptoms.  No chest pain shortness of breath.  No fever shakes chills.  He has had no abdominal surgery.  He is a social drinker.  He denies constipation. His last bowel movement was this morning.  He reports he has had similar symptoms but not this bad in the past.  He was evaluated at [facility] several years ago and had an abdominal ultrasound that showed what appears to be some sludging in the gallbladder and some gallstones.  He does not have a GI doctor and he does not have a PCP.  He works in [employer] as a Training and development officer.  He was supposed to work today but called out sick.  No red flag signs or symptoms elicited on history.     Past Medical History:  Diagnosis Date  . Asthma   . Gout     There are no problems to display for this patient.   Past Surgical History:  Procedure Laterality Date  . NO PAST SURGERIES         Home Medications    Prior to Admission medications   Medication Sig Start Date End Date Taking? Authorizing Provider  albuterol (VENTOLIN HFA) 108 (90 Base)  MCG/ACT inhaler Inhale into the lungs every 6 (six) hours as needed for wheezing or shortness of breath.    [provider]  allopurinol (ZYLOPRIM) 100 MG tablet Take 1 tablet (100 mg total) by mouth daily. 11/17/20   Coral Spikes, DO  colchicine 0.6 MG tablet Take 1 tablet (0.6 mg total) by mouth daily. 11/17/20   Coral Spikes, DO  ibuprofen (ADVIL) 600 MG tablet Take 1 tablet (600 mg total) by mouth every 6 (six) hours as needed. 03/15/20   Melynda Ripple, MD  meloxicam (MOBIC) 7.5 MG tablet Take 1 tablet (7.5 mg total) by mouth daily. 08/23/20   Margarette Canada, NP  predniSONE (DELTASONE) 10 MG tablet 50 mg daily x 3 days, then 40 mg daily x 3 days, then 30 mg daily x 3 days, then 20 mg daily x 3 days, then 10 mg daily x 3 days. 11/17/20   Coral Spikes, DO    Family History Family History  Problem Relation Age of Onset  . Diabetes Mother   . Heart disease Mother   . COPD Mother     Social History Social History   Tobacco  Use  . Smoking status: Never Smoker  . Smokeless tobacco: Never Used  Vaping Use  . Vaping Use: Never used  Substance Use Topics  . Alcohol use: Yes    Comment: rare  . Drug use: Never     Allergies   Patient has no known allergies.   Review of Systems Review of Systems  Constitutional: Positive for appetite change. Negative for chills, diaphoresis and fever.  HENT: Negative for congestion, ear discharge, ear pain, rhinorrhea, sinus pressure, sinus pain and sore throat.   Eyes: Negative for pain.  Respiratory: Negative for cough, chest tightness, shortness of breath, wheezing and stridor.   Cardiovascular: Negative for chest pain and palpitations.  Gastrointestinal: Positive for diarrhea, nausea and vomiting. Negative for abdominal distention, abdominal pain, anal bleeding, blood in stool, constipation and rectal pain.  Genitourinary: Negative for dysuria, flank pain, frequency, hematuria, penile discharge, penile pain and urgency.  Musculoskeletal:  Negative for arthralgias, myalgias and neck pain.  Skin: Negative for color change, pallor, rash and wound.  Neurological: Negative for weakness, light-headedness and headaches.  All other systems reviewed and are negative.    Physical Exam Triage Vital Signs ED Triage Vitals [12/18/20 0819]  Enc Vitals Group     BP (!) 152/102     Pulse Rate 85     Resp 16     Temp 97.7 F (36.5 C)     Temp Source Oral     SpO2 100 %     Weight 200 lb (90.7 kg)     Height 5' 6" (1.676 m)     Head Circumference      Peak Flow      Pain Score 6     Pain Loc      Pain Edu?      Excl. in [place]?    No data found.  Updated Vital Signs BP (!) 152/102   Pulse 85   Temp 97.7 F (36.5 C) (Oral)   Resp 16   Ht 5' 6" (1.676 m)   Wt 90.7 kg   SpO2 100%   BMI 32.28 kg/m   Visual Acuity Right Eye Distance:   Left Eye Distance:   Bilateral Distance:    Right Eye Near:   Left Eye Near:    Bilateral Near:     Physical Exam Vitals and nursing note reviewed.  Constitutional:      General: He is not in acute distress.    Appearance: He is well-developed. He is not ill-appearing, toxic-appearing or diaphoretic.  HENT:     Head: Normocephalic and atraumatic.     Mouth/Throat:     Mouth: Mucous membranes are moist.     Pharynx: Oropharynx is clear. No pharyngeal swelling or oropharyngeal exudate.  Eyes:     General: No scleral icterus.    Extraocular Movements: Extraocular movements intact.     Pupils: Pupils are equal, round, and reactive to light.  Cardiovascular:     Rate and Rhythm: Normal rate and regular rhythm.     Heart sounds: Normal heart sounds. No murmur heard. No friction rub. No gallop.   Pulmonary:     Effort: Pulmonary effort is normal. No respiratory distress.     Breath sounds: Normal breath sounds. No wheezing, rhonchi or rales.  Abdominal:     General: Bowel sounds are increased.     Palpations: Abdomen is soft. There is no shifting dullness, fluid wave,  hepatomegaly, splenomegaly or mass.     Tenderness: There is  abdominal tenderness in the right upper quadrant, epigastric area and left upper quadrant. There is no right CVA tenderness, left CVA tenderness, guarding or rebound. Negative signs include Murphy's sign, Rovsing's sign, McBurney's sign, psoas sign and obturator sign.  Skin:    General: Skin is warm and dry.     Capillary Refill: Capillary refill takes less than 2 seconds.  Neurological:     General: No focal deficit present.     Mental Status: He is alert and oriented to person, place, and time.  Psychiatric:        Mood and Affect: Mood normal.        Behavior: Behavior normal.      UC Treatments / Results  Labs (all labs ordered are listed, but only abnormal results are displayed) Labs Reviewed  COMPREHENSIVE METABOLIC PANEL - Abnormal; Notable for the following components:      Result Value   Chloride 96 (*)    Glucose, Bld 121 (*)    Total Protein 8.2 (*)    Alkaline Phosphatase 37 (*)    Anion gap 16 (*)    All other components within normal limits  URINALYSIS, COMPLETE (UACMP) WITH MICROSCOPIC - Abnormal; Notable for the following components:   Hgb urine dipstick TRACE (*)    Bacteria, UA FEW (*)    All other components within normal limits  AMYLASE  LIPASE, BLOOD  CBC WITH DIFFERENTIAL/PLATELET    EKG   Radiology No results found.  Procedures Procedures (including critical care time)  Medications Ordered in UC Medications - No data to display  Initial Impression / Assessment and Plan / UC Course  I have reviewed the triage vital signs and the nursing notes.  Pertinent labs & imaging results that were available during my care of the patient were reviewed by me and considered in my medical decision making (see chart for details).  Clinical impression: Pleasant 45 year old male who presents with acute abdominal pain since 3 AM.  He is also having nausea and 2 episodes of emesis.  He has had similar  symptoms in the past and had an ultrasound several years ago that showed gallstones and sludging in his gallbladder.  Clinically he is stable and his abdomen is not rigid.  No acute abdomen.  Probable irritation of the gallbladder versus early cholecystitis.  Treatment plan: 1.  The findings and treatment plan were discussed in detail with the patient.  Patient was in agreement. 2.  Recommended getting some labs.  We will get a CBC, c-Met, amylase, lipase, and a UA. 3.  He will ultimately need an abdominal ultrasound.  Depending on the labs we will see if we can schedule that over the hospital today. 4.  CBC was within normal limits with no elevation in white count.  CMP showed a few lab abnormalities most notably a glucose of 121.  Patient probably has some baseline type 2 diabetes versus insulin insensitivity.  Patient's only had cranberry juice in the last 6 hours. 5.  Physical exam is reassuring but given his history of having problems in the past and what sounds like gallstones and biliary sludging we will get an ultrasound at the hospital as an outpatient.  He will need referral to a GI doctor.  We will also establish care with a primary care physician given his elevation in his glucose. 6.  We will give him a work note saying he was seen today. 7.  Just supportive care.  Red flag signs and symptoms were  discussed.  Certainly if his abdominal pain was to get worse after he had his ultrasound he should go directly to an emergency room setting for further evaluation. 8.  Discharge from care at this time.     Final Clinical Impressions(s) / UC Diagnoses   Final diagnoses:  Right upper quadrant abdominal pain  Nausea vomiting and diarrhea     Discharge Instructions     You have been scheduled for an ultrasound of your abdomen as an outpatient.  I provided educational handout on your abdominal pain nausea vomiting and diarrhea.  Please read this.  You need to establish care with a  primary care physician.  I have put in a referral.  I would advise you to call around and see who is accepting new patients.  You have uncontrolled gout and have been seen twice in the past month for this.  In addition your blood glucose was elevated today and I suspect you have some early type 2 diabetes.  You are going to need to be followed for that with your primary care physician.  If your abdominal pain worsens and you have uncontrolled nausea and vomiting you need to go to the emergency room setting for a higher level of care.  I hope you get to feeling better, Dr. Drema Dallas    ED Prescriptions    None     PDMP not reviewed this encounter.   Verda Cumins, MD 12/18/20 1011

## 2020-12-18 NOTE — ED Triage Notes (Signed)
Pt reports having abd pain that began this morning. Also reports having n/v/d.

## 2020-12-18 NOTE — Discharge Instructions (Signed)
You have been scheduled for an ultrasound of your abdomen as an outpatient.  I provided educational handout on your abdominal pain nausea vomiting and diarrhea.  Please read this.  You need to establish care with a primary care physician.  I have put in a referral.  I would advise you to call around and see who is accepting new patients.  You have uncontrolled gout and have been seen twice in the past month for this.  In addition your blood glucose was elevated today and I suspect you have some early type 2 diabetes.  You are going to need to be followed for that with your primary care physician.  If your abdominal pain worsens and you have uncontrolled nausea and vomiting you need to go to the emergency room setting for a higher level of care.  I hope you get to feeling better, Dr. Zachery Dauer

## 2020-12-18 NOTE — ED Notes (Signed)
Pt called for updated vital signs. Pt not visualized in the lobby at this time. No answer when called.

## 2020-12-18 NOTE — ED Notes (Signed)
[place] appointment scheduled for pt.

## 2020-12-18 NOTE — ED Notes (Signed)
Pt called for updated vital signed, no answer. Pt not visualized in the lobby.

## 2020-12-18 NOTE — ED Notes (Signed)
Pt called for VS recheck. Pt is not in the lobby. No answer x 3 when called.

## 2020-12-19 ENCOUNTER — Telehealth: Payer: Self-pay | Admitting: Sports Medicine

## 2020-12-19 NOTE — Telephone Encounter (Signed)
After his CT scan was completed, the patient was apprised of his results.  I recommended that he go to the emergency room at [facility] for further evaluation and management.  Patient would probably need admission to the hospital and consultation with general surgery for consideration of a cholecystectomy.  Patient voiced verbal understanding.  The staff at the CT department took him directly to the emergency room.

## 2021-05-04 ENCOUNTER — Other Ambulatory Visit: Payer: Self-pay | Admitting: Family Medicine

## 2021-07-01 ENCOUNTER — Other Ambulatory Visit: Payer: Self-pay | Admitting: Family Medicine

## 2022-02-07 ENCOUNTER — Ambulatory Visit
Admission: EM | Admit: 2022-02-07 | Discharge: 2022-02-07 | Disposition: A | Discharge status: Home / Self Care | Payer: Self-pay | Attending: Physician Assistant | Admitting: Physician Assistant

## 2022-02-07 ENCOUNTER — Other Ambulatory Visit: Payer: Self-pay

## 2022-02-07 ENCOUNTER — Encounter: Payer: Self-pay | Admitting: Emergency Medicine

## 2022-02-07 DIAGNOSIS — Z8739 Personal history of other diseases of the musculoskeletal system and connective tissue: Secondary | ICD-10-CM

## 2022-02-07 DIAGNOSIS — B351 Tinea unguium: Secondary | ICD-10-CM

## 2022-02-07 DIAGNOSIS — M25562 Pain in left knee: Secondary | ICD-10-CM

## 2022-02-07 MED ORDER — PREDNISONE 10 MG PO TABS: ORAL_TABLET | ORAL | 0 refills | Status: DC

## 2022-02-07 NOTE — ED Provider Notes (Signed)
?[facility] ? ? ? ?CSN: [id number] ?Arrival date & time: 02/07/22  0827 ? ? ?  ? ?History   ?Chief Complaint ?Chief Complaint  ?Patient presents with  ? Knee Pain  ? Toe Pain  ? ? ?HPI ?Nathaniel [name] is a 46 y.o. male presenting for 2 separate concerns.  First, he states that he started having left knee pain and swelling 3 days ago.  Reports that had occurred after work.  States the swelling is gotten worse overnight.  He has been applying ice and wearing a knee brace.  Is also taken ibuprofen but no significant changes in condition.  No overlying redness.  No fevers.  Reports reduced range of motion of the knee due to pain and swelling.  He has also been walking with a limp.  Patient reports history of gouty arthritis.  He says he has had it in his knees.  He has been seen here on multiple occasions for gout flareup of his knees.  He reports prednisone is helped in the past. ? ?Patient also presenting for concerns about toenail abnormality of the right great toe over the past several months.  The toenail is thick and yellow.  He reports that he has been cutting it back and now it is barely hanging on.  He also reports that he has hyperpigmented skin of the cuticle.  Reports occasional pustular drainage from the area.  Area is tender when it is touched.  He has been trying to keep the toe clean but not treating it in any other way.  No other complaints. ? ?HPI ? ?Past Medical History:  ?Diagnosis Date  ? Asthma   ? Gout   ? ? ?There are no problems to display for this patient. ? ? ?Past Surgical History:  ?Procedure Laterality Date  ? NO PAST SURGERIES    ? ? ? ? ? ?Home Medications   ? ?Prior to Admission medications   ?Medication Sig Start Date End Date Taking? Authorizing Provider  ?predniSONE (DELTASONE) 10 MG tablet Take 6 tabs p.o. on day 1 and decrease by 1 tablet daily until complete 02/07/22  Yes Eusebio Friendly B, PA-C  ?albuterol (VENTOLIN HFA) 108 (90 Base) MCG/ACT inhaler Inhale into the  lungs every 6 (six) hours as needed for wheezing or shortness of breath.    [provider]  ?allopurinol (ZYLOPRIM) 100 MG tablet TAKE 1 TABLET BY MOUTH EVERY DAY 07/01/21   Sherlene Shams, MD  ?colchicine 0.6 MG tablet Take 1 tablet (0.6 mg total) by mouth daily. 11/17/20   Tommie Sams, DO  ? ? ?Family History ?Family History  ?Problem Relation Age of Onset  ? Diabetes Mother   ? Heart disease Mother   ? COPD Mother   ? ? ?Social History ?Social History  ? ?Tobacco Use  ? Smoking status: Never  ? Smokeless tobacco: Never  ?Vaping Use  ? Vaping Use: Never used  ?Substance Use Topics  ? Alcohol use: Yes  ?  Comment: rare  ? Drug use: Never  ? ? ? ?Allergies   ?Patient has no known allergies. ? ? ?Review of Systems ?Review of Systems  ?Constitutional:  Negative for fatigue and fever.  ?Musculoskeletal:  Positive for arthralgias, gait problem and joint swelling.  ?Skin:  Positive for color change. Negative for wound.  ?Neurological:  Negative for weakness and numbness.  ? ? ?Physical Exam ?Triage Vital Signs ?ED Triage Vitals  ?Enc Vitals Group  ?   BP  02/07/22 0839 120/86  ?   Pulse Rate 02/07/22 0839 88  ?   Resp 02/07/22 0839 16  ?   Temp 02/07/22 0839 98.4 ?F (36.9 ?C)  ?   Temp Source 02/07/22 0839 Oral  ?   SpO2 02/07/22 0839 100 %  ?   Weight 02/07/22 0837 199 lb 15.3 oz (90.7 kg)  ?   Height 02/07/22 0837 5\' 6"  (1.676 m)  ?   Head Circumference --   ?   Peak Flow --   ?   Pain Score 02/07/22 0837 5  ?   Pain Loc --   ?   Pain Edu? --   ?   Excl. in [place]? --   ? ?No data found. ? ?Updated Vital Signs ?BP 120/86 (BP Location: Left Arm)   Pulse 88   Temp 98.4 ?F (36.9 ?C) (Oral)   Resp 16   Ht 5\' 6"  (1.676 m)   Wt 199 lb 15.3 oz (90.7 kg)   SpO2 100%   BMI 32.27 kg/m?  ?   ? ?Physical Exam ?Vitals and nursing note reviewed.  ?Constitutional:   ?   General: He is not in acute distress. ?   Appearance: Normal appearance. He is well-developed. He is not ill-appearing.  ?HENT:  ?   Head: Normocephalic  and atraumatic.  ?Eyes:  ?   General: No scleral icterus. ?   Conjunctiva/sclera: Conjunctivae normal.  ?Cardiovascular:  ?   Rate and Rhythm: Normal rate and regular rhythm.  ?Pulmonary:  ?   Effort: Pulmonary effort is normal. No respiratory distress.  ?Musculoskeletal:  ?   Cervical back: Neck supple.  ?   Left knee: Swelling (moderate swelling of anterior knee) and bony tenderness (anterior joint line) present. No erythema or ecchymosis. Decreased range of motion (decreased beyond 90 degrees flexion). Tenderness present over the medial joint line. Normal pulse.  ?Skin: ?   General: Skin is warm and dry.  ?   Capillary Refill: Capillary refill takes less than 2 seconds.  ?   Comments: RIGHT GREAT TOE: See image below.  Patient has thick yellow toenail of right great toe.  Toenail is pulled away from the cuticle.  It is tender to touch.  He has hyperpigmented skin of the cuticle.  No drainage, erythema or warmth.  ?Neurological:  ?   General: No focal deficit present.  ?   Mental Status: He is alert. Mental status is at baseline.  ?   Motor: No weakness.  ?   Coordination: Coordination normal.  ?   Gait: Gait normal.  ?Psychiatric:     ?   Mood and Affect: Mood normal.     ?   Thought Content: Thought content normal.  ? ? ? ? ?UC Treatments / Results  ?Labs ?(all labs ordered are listed, but only abnormal results are displayed) ?Labs Reviewed - No data to display ? ?EKG ? ? ?Radiology ?No results found. ? ?Procedures ?Procedures (including critical care time) ? ?Medications Ordered in UC ?Medications - No data to display ? ?Initial Impression / Assessment and Plan / UC Course  ?I have reviewed the triage vital signs and the nursing notes. ? ?Pertinent labs & imaging results that were available during my care of the patient were reviewed by me and considered in my medical decision making (see chart for details). ? ?46 year old male presenting for left knee pain and swelling of the past 3 days.  History of gout.   Has tried NSAIDs,  ice, rest, elevation and a knee brace.  Patient does have moderate swelling of the left great knee with reduced range of motion beyond 90 degrees.  Given his history of gout and especially gout flareup of the knee, suspect he is having a acute flareup due to gout of the left knee.  We will treat with prednisone since it helped in the past.  Also advised him to continue with RICE guidelines and wearing the brace. ? ?In regards to the toenail of the right great foot, he has significant fungal toenail infection.  The toenail is pulled away from the cuticle and very tender to touch.  Advised he will likely need to have it removed and may need to have further antifungal oral medication.  Placed a referral to podiatry.  In the meantime, advised him to keep the area clean and dry.  Follow-up for signs of infection.  Work note given. ? ? ?Final Clinical Impressions(s) / UC Diagnoses  ? ?Final diagnoses:  ?Acute pain of left knee  ?History of gout  ?Onychomycosis of great toe  ? ? ? ?Discharge Instructions   ? ?  ?-Your knee pain and swelling are likely related to underlying arthritis, possibly gout given your history.  I sent prednisone since it helped in the past.  Continue to wear the brace and ice and elevate the knee. ?- I placed a referral to podiatry for your toe.  You have a significant toenail infection and the toenail will likely need to be removed.  You may also need to be treated with oral antifungals, but I am not sure so I would like you to see a specialist. ? ? ? ? ?ED Prescriptions   ? ? Medication Sig Dispense Auth. Provider  ? predniSONE (DELTASONE) 10 MG tablet Take 6 tabs p.o. on day 1 and decrease by 1 tablet daily until complete 21 tablet Shirlee LatchEaves, Marquavious Nazar B, PA-C  ? ?  ? ?PDMP not reviewed this encounter. ?  ?Shirlee Latchaves, [place] Cen B, PA-C ?02/07/22 [id number] ? ?

## 2022-02-07 NOTE — ED Triage Notes (Signed)
Pt reports left knee swelling and pain since Friday. Pt states he has been using ice to help with swelling.  ?Pt also reports right big toe pain for months. Pt states he thinks it has inflammation and it is discolored.  ?

## 2022-02-07 NOTE — Discharge Instructions (Addendum)
-  Your knee pain and swelling are likely related to underlying arthritis, possibly gout given your history.  I sent prednisone since it helped in the past.  Continue to wear the brace and ice and elevate the knee. ?- I placed a referral to podiatry for your toe.  You have a significant toenail infection and the toenail will likely need to be removed.  You may also need to be treated with oral antifungals, but I am not sure so I would like you to see a specialist. ?

## 2022-02-15 ENCOUNTER — Ambulatory Visit
Admission: EM | Admit: 2022-02-15 | Discharge: 2022-02-15 | Disposition: A | Discharge status: Home / Self Care | Payer: Self-pay | Attending: Emergency Medicine | Admitting: Emergency Medicine

## 2022-02-15 ENCOUNTER — Other Ambulatory Visit: Payer: Self-pay

## 2022-02-15 DIAGNOSIS — L03031 Cellulitis of right toe: Secondary | ICD-10-CM

## 2022-02-15 MED ORDER — HYDROCODONE-ACETAMINOPHEN 5-325 MG PO TABS: 1.0000 | ORAL_TABLET | ORAL | 0 refills | Status: DC | PRN

## 2022-02-15 MED ORDER — DOXYCYCLINE HYCLATE 100 MG PO CAPS: 100.0000 mg | ORAL_CAPSULE | Freq: Two times a day (BID) | ORAL | 0 refills | Status: DC

## 2022-02-15 NOTE — ED Triage Notes (Signed)
Pt c/o right big toe swelling. Pt states that he woke up with his toe swollen and painful. Pt states that it is throbbing and he felt it with his pulse.  ? ?Pt is wearing an ankle brace and compression socks.  ? ?Pt ripped the nail off of his big toe in December and still has it bandaged. Pt states that it is not leaking currently.  ?

## 2022-02-15 NOTE — ED Provider Notes (Signed)
?[facility] ? ? ? ?CSN: [id number] ?Arrival date & time: 02/15/22  1543 ? ? ?  ? ?History   ?Chief Complaint ?Chief Complaint  ?Patient presents with  ? Toe Problem  ? ? ?HPI ?Zyshonne Malecha is a 46 y.o. male.  ? ?HPI ? ?46 year old male here for evaluation of right toe pain. ? ?Patient reports that he was stripping his right great toenail in December and cut it too short and has been keeping a dressing on his toe since then.  In the intervening time his toenail has become thick, yellow, and started to grow back into his toe.  This morning he noticed that his toe was red, swollen, throbbing, and painful to touch or walk on.  He currently rates his pain as a 2-3/10 at rest.  He denies any new injury or fever.  He does have a history of gout but this is not a typical gout flare for him. ? ?Past Medical History:  ?Diagnosis Date  ? Asthma   ? Gout   ? ? ?There are no problems to display for this patient. ? ? ?Past Surgical History:  ?Procedure Laterality Date  ? NO PAST SURGERIES    ? ? ? ? ? ?Home Medications   ? ?Prior to Admission medications   ?Medication Sig Start Date End Date Taking? Authorizing Provider  ?albuterol (VENTOLIN HFA) 108 (90 Base) MCG/ACT inhaler Inhale into the lungs every 6 (six) hours as needed for wheezing or shortness of breath.   Yes [provider]  ?allopurinol (ZYLOPRIM) 100 MG tablet TAKE 1 TABLET BY MOUTH EVERY DAY 07/01/21  Yes Sherlene Shams, MD  ?colchicine 0.6 MG tablet Take 1 tablet (0.6 mg total) by mouth daily. 11/17/20  Yes Tommie Sams, DO  ?doxycycline (VIBRAMYCIN) 100 MG capsule Take 1 capsule (100 mg total) by mouth 2 (two) times daily. 02/15/22  Yes Becky Augusta, NP  ?HYDROcodone-acetaminophen (NORCO/VICODIN) 5-325 MG tablet Take 1-2 tablets by mouth every 4 (four) hours as needed. 02/15/22  Yes Becky Augusta, NP  ?predniSONE (DELTASONE) 10 MG tablet Take 6 tabs p.o. on day 1 and decrease by 1 tablet daily until complete 02/07/22  Yes Shirlee Latch, PA-C   ? ? ?Family History ?Family History  ?Problem Relation Age of Onset  ? Diabetes Mother   ? Heart disease Mother   ? COPD Mother   ? ? ?Social History ?Social History  ? ?Tobacco Use  ? Smoking status: Some Days  ?  Types: Cigars  ? Smokeless tobacco: Never  ?Vaping Use  ? Vaping Use: Never used  ?Substance Use Topics  ? Alcohol use: Yes  ?  Comment: rare  ? Drug use: Never  ? ? ? ?Allergies   ?Patient has no known allergies. ? ? ?Review of Systems ?Review of Systems  ?Musculoskeletal:  Positive for arthralgias, joint swelling and myalgias.  ?Skin:  Positive for color change.  ?Neurological:  Negative for numbness.  ?Hematological: Negative.   ?Psychiatric/Behavioral: Negative.    ? ? ?Physical Exam ?Triage Vital Signs ?ED Triage Vitals  ?Enc Vitals Group  ?   BP --   ?   Pulse --   ?   Resp --   ?   Temp --   ?   Temp src --   ?   SpO2 --   ?   Weight 02/15/22 1631 192 lb (87.1 kg)  ?   Height 02/15/22 1631 5\' 6"  (1.676 m)  ?  Head Circumference --   ?   Peak Flow --   ?   Pain Score 02/15/22 1630 10  ?   Pain Loc --   ?   Pain Edu? --   ?   Excl. in GC? --   ? ?No data found. ? ?Updated Vital Signs ?BP (!) 126/95 (BP Location: Left Arm)   Pulse 82   Temp 98.2 ?F (36.8 ?C) (Oral)   Resp 18   Ht 5\' 6"  (1.676 m)   Wt 192 lb (87.1 kg)   SpO2 100%   BMI 30.99 kg/m?  ? ?Visual Acuity ?Right Eye Distance:   ?Left Eye Distance:   ?Bilateral Distance:   ? ?Right Eye Near:   ?Left Eye Near:    ?Bilateral Near:    ? ?Physical Exam ?Vitals and nursing note reviewed.  ?Constitutional:   ?   Appearance: Normal appearance. He is not ill-appearing.  ?HENT:  ?   Head: Normocephalic and atraumatic.  ?Musculoskeletal:     ?   General: Swelling and tenderness present. No deformity or signs of injury. Normal range of motion.  ?Skin: ?   Capillary Refill: Capillary refill takes less than 2 seconds.  ?   Findings: Erythema present.  ?Neurological:  ?   General: No focal deficit present.  ?   Mental Status: He is alert and  oriented to person, place, and time.  ?   Sensory: No sensory deficit.  ?   Motor: No weakness.  ?Psychiatric:     ?   Mood and Affect: Mood normal.     ?   Behavior: Behavior normal.     ?   Thought Content: Thought content normal.     ?   Judgment: Judgment normal.  ? ? ? ?UC Treatments / Results  ?Labs ?(all labs ordered are listed, but only abnormal results are displayed) ?Labs Reviewed - No data to display ? ?EKG ? ? ?Radiology ?No results found. ? ?Procedures ?Procedures (including critical care time) ? ?Medications Ordered in UC ?Medications - No data to display ? ?Initial Impression / Assessment and Plan / UC Course  ?I have reviewed the triage vital signs and the nursing notes. ? ?Pertinent labs & imaging results that were available during my care of the patient were reviewed by me and considered in my medical decision making (see chart for details). ? ?Patient is a nontoxic-appearing 45 year old male here for evaluation of right great toe pain, swelling, and redness that started this morning.  Patient presented with a Band-Aid over his great toenail that he reports he has been wearing since December when he cut his toenail too short.  Went ahead and remove the Band-Aid the toenail is thick, yellow, and is pushed back into the skin to the level of the IP joint.  There is a dark area of skin at the superior aspect of the nail cuticle with yellow pus draining from the skin in the medial, inferior, and proximal aspect of the great toe is erythematous and warm to touch.  The patient has tenderness with palpation of the IP joint and with palpation of the MTP joint.  His cap refill is less than 2 seconds and his DP and PT pulses are 2+.  The patient exam is consistent with cellulitis of his toe and I believe that his toenail needs to be removed.  I will refer the patient to podiatry and get him started on doxycycline to combat the infection.  I will also  give him a short prescription of Norco that he can use as  needed for severe pain and he can use over-the-counter Tylenol and ibuprofen according to package instructions needed for mild pain.  Work note provided. ? ? ?Final Clinical Impressions(s) / UC Diagnoses  ? ?Final diagnoses:  ?Cellulitis of toe of right foot  ? ? ? ?Discharge Instructions   ? ?  ?As we discussed, your toenail is growing back into the cuticle and it is also causing an infection of your big toe. ? ?Take the doxycycline twice daily with food for 10 days for treatment of the infection. ? ?Keep your foot open to air is much as possible to help prevent more bacteria from growing. ? ?Soak your foot in warm water and Epsom salt 2-3 times a day to help facilitate drainage. ? ?Use over-the-counter Tylenol and ibuprofen according to package instructions as needed for mild to moderate pain. ? ?I have written you a prescription for Norco that she can have 1 to 2 tablets every 6 hours as needed for severe pain.  Be mindful that this has Tylenol in it as well so do not take more than 4000 mg of Tylenol a day. ? ?I have made referral to [facility] in [place], they will be reaching out to you for an appointment. ? ?If you have any increasing redness, swelling, red streaks going up your foot, or if you develop a fever please go to the ER for evaluation. ? ? ? ? ?ED Prescriptions   ? ? Medication Sig Dispense Auth. Provider  ? doxycycline (VIBRAMYCIN) 100 MG capsule Take 1 capsule (100 mg total) by mouth 2 (two) times daily. 20 capsule Becky Augustayan, Rykar Lebleu, NP  ? HYDROcodone-acetaminophen (NORCO/VICODIN) 5-325 MG tablet Take 1-2 tablets by mouth every 4 (four) hours as needed. 10 tablet Becky Augustayan, Geddy Boydstun, NP  ? ?  ? ?I have reviewed the PDMP during this encounter. ?  ?Becky Augustayan, Khristian Phillippi, NP ?02/15/22 1657 ? ?

## 2022-02-15 NOTE — Discharge Instructions (Addendum)
As we discussed, your toenail is growing back into the cuticle and it is also causing an infection of your big toe. ? ?Take the doxycycline twice daily with food for 10 days for treatment of the infection. ? ?Keep your foot open to air is much as possible to help prevent more bacteria from growing. ? ?Soak your foot in warm water and Epsom salt 2-3 times a day to help facilitate drainage. ? ?Use over-the-counter Tylenol and ibuprofen according to package instructions as needed for mild to moderate pain. ? ?I have written you a prescription for Norco that she can have 1 to 2 tablets every 6 hours as needed for severe pain.  Be mindful that this has Tylenol in it as well so do not take more than 4000 mg of Tylenol a day. ? ?I have made referral to [facility] in [place], they will be reaching out to you for an appointment. ? ?If you have any increasing redness, swelling, red streaks going up your foot, or if you develop a fever please go to the ER for evaluation. ?

## 2022-02-20 ENCOUNTER — Ambulatory Visit
Admission: EM | Admit: 2022-02-20 | Discharge: 2022-02-20 | Disposition: A | Discharge status: Home / Self Care | Payer: Self-pay | Attending: Emergency Medicine | Admitting: Emergency Medicine

## 2022-02-20 ENCOUNTER — Other Ambulatory Visit: Payer: Self-pay

## 2022-02-20 DIAGNOSIS — M109 Gout, unspecified: Secondary | ICD-10-CM

## 2022-02-20 DIAGNOSIS — B351 Tinea unguium: Secondary | ICD-10-CM

## 2022-02-20 DIAGNOSIS — M79674 Pain in right toe(s): Secondary | ICD-10-CM

## 2022-02-20 MED ORDER — PREDNISONE 10 MG (21) PO TBPK: ORAL_TABLET | ORAL | 0 refills | Status: DC

## 2022-02-20 MED ORDER — ALLOPURINOL 100 MG PO TABS: 200.0000 mg | ORAL_TABLET | Freq: Every day | ORAL | 1 refills | Status: DC

## 2022-02-20 NOTE — Discharge Instructions (Addendum)
Start the prednisone this morning and take it each morning at breakfast for 6 days for treatment of gout. ? ?Increase your allopurinol to 200 mg daily for prevention of gout. ? ?Follow the low purine diet as directed in your discharge. ? ?Call podiatry on Monday to schedule an appointment to have your toenail removed. ? ?Increase your oral water intake to a goal of 1 gallon daily to help prevent your gout. ?

## 2022-02-20 NOTE — ED Triage Notes (Signed)
Pt presents with pain/inflammation of L knee.  Believes it is gout going between foot and knee.  Has been taking Doxy and Hydrocodone for pain and infected toenail.  Is also taking Ibuprofen and has tried ice, elevation.  Did have a fairly active day walking on it yesterday.  Tends to be worse at night.  ? ?Has ace wrap applied to L knee with some relief.  Had some swelling prior to applying to ace.  ? ?Took Hydrocodone around 0800 this morning. States he only has one left.  ?

## 2022-02-20 NOTE — ED Provider Notes (Signed)
?[facility] ? ? ? ?CSN: [id number] ?Arrival date & time: 02/20/22  1026 ? ? ?  ? ?History   ?Chief Complaint ?Chief Complaint  ?[name] presents with  ? Knee Pain  ?  L  ? ? ?Nathaniel ?Nathaniel [name] is a 46 y.o. male.  ? ?Nathaniel ? ?46 year old male here for evaluation of left knee pain. ? ?[name] reports that he has been experiencing pain in his left knee for the past 2 weeks.  He states that is red in the morning and that he soaks in Epsom salts and the redness improves.  It is swollen and hot.  He was evaluated in this urgent care on 02/07/2022 and diagnosed with gout and was placed on prednisone.  He states that he took the prednisone and his symptoms improved but have now returned.  He does have a history of gout and states that he does not eat any red meat, eats fish in moderation, and does not consume any spinach.  He drinks tart cherry juice and fruit juices but does not drink any water.  He was placed on allopurinol and colchicine in the past.  He does not have a PCP and he comes here for evaluation ration and management of his acute issues.  He is currently out of the allopurinol.  At the March visit he was diagnosed with onychomycosis of his toenails and was referred to podiatry.  [name] has not been to see podiatry.  He again presented for evaluation of pain in his right great toe 5 days ago and was placed on doxycycline and hydrocodone for his pain and subsequently referred to podiatry.  He has still not followed up.  Has continued to work and walk on his feet and knees. ? ?Past Medical History:  ?Diagnosis Date  ? Asthma   ? Gout   ? ? ?There are no problems to display for this [name]. ? ? ?Past Surgical History:  ?Procedure Laterality Date  ? NO PAST SURGERIES    ? ? ? ? ? ?Home Medications   ? ?Prior to Admission medications   ?Medication Sig Start Date End Date Taking? Authorizing Provider  ?allopurinol (ZYLOPRIM) 100 MG tablet Take 2 tablets (200 mg total) by mouth daily. 02/20/22 03/22/22 Yes  Becky Augusta, NP  ?predniSONE (STERAPRED UNI-PAK 21 TAB) 10 MG (21) TBPK tablet Take 6 tablets on day 1, 5 tablets day 2, 4 tablets day 3, 3 tablets day 4, 2 tablets day 5, 1 tablet day 6 02/20/22  Yes Becky Augusta, NP  ?albuterol (VENTOLIN HFA) 108 (90 Base) MCG/ACT inhaler Inhale into the lungs every 6 (six) hours as needed for wheezing or shortness of breath.    [provider]  ?colchicine 0.6 MG tablet Take 1 tablet (0.6 mg total) by mouth daily. 11/17/20   Tommie Sams, DO  ?doxycycline (VIBRAMYCIN) 100 MG capsule Take 1 capsule (100 mg total) by mouth 2 (two) times daily. 02/15/22   Becky Augusta, NP  ?HYDROcodone-acetaminophen (NORCO/VICODIN) 5-325 MG tablet Take 1-2 tablets by mouth every 4 (four) hours as needed. 02/15/22   Becky Augusta, NP  ? ? ?Family History ?Family History  ?Problem Relation Age of Onset  ? Diabetes Mother   ? Heart disease Mother   ? COPD Mother   ? ? ?Social History ?Social History  ? ?Tobacco Use  ? Smoking status: Some Days  ?  Types: Cigars  ? Smokeless tobacco: Never  ?Vaping Use  ? Vaping Use: Never used  ?  Substance Use Topics  ? Alcohol use: Yes  ?  Comment: rare  ? Drug use: Never  ? ? ? ?Allergies   ?[name] has no known allergies. ? ? ?Review of Systems ?Review of Systems  ?Constitutional:  Negative for fever.  ?Musculoskeletal:  Positive for arthralgias and joint swelling. Negative for myalgias.  ?Skin:  Positive for color change.  ?Hematological: Negative.   ?Psychiatric/Behavioral: Negative.    ? ? ?Physical Exam ?Triage Vital Signs ?ED Triage Vitals  ?Enc Vitals Group  ?   BP 02/20/22 1104 (!) 135/105  ?   Pulse Rate 02/20/22 1104 92  ?   Resp 02/20/22 1104 20  ?   Temp 02/20/22 1104 98 ?F (36.7 ?C)  ?   Temp Source 02/20/22 1104 Oral  ?   SpO2 02/20/22 1104 100 %  ?   Weight --   ?   Height --   ?   Head Circumference --   ?   Peak Flow --   ?   Pain Score 02/20/22 1105 10  ?   Pain Loc --   ?   Pain Edu? --   ?   Excl. in GC? --   ? ?No data found. ? ?Updated Vital  Signs ?BP (!) 135/105 (BP Location: Left Arm)   Pulse 92   Temp 98 ?F (36.7 ?C) (Oral)   Resp 20   SpO2 100%  ? ?Visual Acuity ?Right Eye Distance:   ?Left Eye Distance:   ?Bilateral Distance:   ? ?Right Eye Near:   ?Left Eye Near:    ?Bilateral Near:    ? ?Physical Exam ?Vitals and nursing note reviewed.  ?Constitutional:   ?   Appearance: Normal appearance. He is not ill-appearing.  ?HENT:  ?   Head: Normocephalic and atraumatic.  ?Musculoskeletal:     ?   General: Swelling and tenderness present. No deformity or signs of injury.  ?Skin: ?   Capillary Refill: Capillary refill takes less than 2 seconds.  ?   Findings: Erythema present.  ?Neurological:  ?   General: No focal deficit present.  ?   Mental Status: He is alert and oriented to person, place, and time.  ?Psychiatric:     ?   Mood and Affect: Mood normal.     ?   Behavior: Behavior normal.     ?   Thought Content: Thought content normal.     ?   Judgment: Judgment normal.  ? ? ? ?UC Treatments / Results  ?Labs ?(all labs ordered are listed, but only abnormal results are displayed) ?Labs Reviewed - No data to display ? ?EKG ? ? ?Radiology ?No results found. ? ?Procedures ?Procedures (including critical care time) ? ?Medications Ordered in UC ?Medications - No data to display ? ?Initial Impression / Assessment and Plan / UC Course  ?I have reviewed the triage vital signs and the nursing notes. ? ?Pertinent labs & imaging results that were available during my care of the [name] were reviewed by me and considered in my medical decision making (see chart for details). ? ?[name] is a nontoxic-appearing 46 year old male who presents for evaluation of left knee pain, swelling, and redness of been going for the past 2 weeks.  He was seen previously for the same complaint and prescribed prednisone and referred to podiatry.  He has been evaluated for gout many times in the past and was placed on allopurinol but has run out and has no PCP for refills.  On exam  [name] does have a mildly edematous and erythematous left knee that is warm to touch.  He has tenderness with palpation of the popliteal space but no tenderness with palpation of the medial lateral joint line, palpation of the patella, or palpation of the quadriceps complex.  He is able to get his knee to 90 degrees but is unable to achieve full extension secondary to pain.  There is no effusion appreciated on exam.  His DP PT pulses in his left foot are 2+.  He does have a thickened yellow great toenail on the left foot.  He was evaluated for onychomycosis and paronychia of the right great toenail 5 days ago and is currently on doxycycline for treatment of that.  I will place the [name] back on a prednisone taper and have him restart his allopurinol at 200 mg a day.  I will also have him increase his oral fluid intake of water with a goal of a gallon a day and cut back on his tart cherry and fruit juices as they may be contributing to his gout.  I have given him a handout on low purine diet and I will have the nursing staff establish a primary care provider for him at discharge.  I have informed the [name] that we cannot continue to manage his chronic medical conditions and he needs a PCP for that.  Work note provided ? ? ?Final Clinical Impressions(s) / UC Diagnoses  ? ?Final diagnoses:  ?Acute gout of left knee, unspecified cause  ?Pain due to onychomycosis of toenail of right foot  ?Onychomycosis of left great toe  ? ? ? ?Discharge Instructions   ? ?  ?Start the prednisone this morning and take it each morning at breakfast for 6 days for treatment of gout. ? ?Increase your allopurinol to 200 mg daily for prevention of gout. ? ?Follow the low purine diet as directed in your discharge. ? ?Call podiatry on Monday to schedule an appointment to have your toenail removed. ? ?Increase your oral water intake to a goal of 1 gallon daily to help prevent your gout. ? ? ? ? ?ED Prescriptions   ? ? Medication Sig Dispense  Auth. Provider  ? predniSONE (STERAPRED UNI-PAK 21 TAB) 10 MG (21) TBPK tablet Take 6 tablets on day 1, 5 tablets day 2, 4 tablets day 3, 3 tablets day 4, 2 tablets day 5, 1 tablet day 6 21 tablet Prudy Feeler

## 2022-02-20 NOTE — ED Triage Notes (Signed)
PCP appt set up - [facility] 04/14  ?

## 2022-02-25 ENCOUNTER — Encounter: Payer: Self-pay | Admitting: Family Medicine

## 2022-02-28 ENCOUNTER — Ambulatory Visit
Admission: RE | Admit: 2022-02-28 | Discharge: 2022-02-28 | Disposition: A | Discharge status: Home / Self Care | Payer: Self-pay | Source: Ambulatory Visit | Attending: Family Medicine | Admitting: Family Medicine

## 2022-02-28 ENCOUNTER — Ambulatory Visit
Admission: RE | Admit: 2022-02-28 | Discharge: 2022-02-28 | Disposition: A | Discharge status: Home / Self Care | Payer: Self-pay | Attending: Family Medicine | Admitting: Family Medicine

## 2022-02-28 ENCOUNTER — Ambulatory Visit (INDEPENDENT_AMBULATORY_CARE_PROVIDER_SITE_OTHER): Payer: Self-pay | Admitting: Family Medicine

## 2022-02-28 ENCOUNTER — Inpatient Hospital Stay (INDEPENDENT_AMBULATORY_CARE_PROVIDER_SITE_OTHER): Payer: Self-pay | Admitting: Radiology

## 2022-02-28 ENCOUNTER — Other Ambulatory Visit: Payer: Self-pay | Admitting: Family Medicine

## 2022-02-28 ENCOUNTER — Encounter: Payer: Self-pay | Admitting: Family Medicine

## 2022-02-28 VITALS — BP 126/82 | HR 99 | Ht 66.0 in | Wt 191.0 lb

## 2022-02-28 DIAGNOSIS — G8929 Other chronic pain: Secondary | ICD-10-CM

## 2022-02-28 DIAGNOSIS — M25462 Effusion, left knee: Secondary | ICD-10-CM

## 2022-02-28 DIAGNOSIS — M79672 Pain in left foot: Secondary | ICD-10-CM | POA: Insufficient documentation

## 2022-02-28 MED ORDER — TRIAMCINOLONE ACETONIDE 40 MG/ML IJ SUSP
40.0000 mg | Freq: Once | INTRAMUSCULAR | Status: AC
  Administered 2022-02-28: 40 mg via INTRAMUSCULAR

## 2022-02-28 MED ORDER — PREDNISONE 50 MG PO TABS: 50.0000 mg | ORAL_TABLET | Freq: Every day | ORAL | 0 refills | Status: DC

## 2022-02-28 MED ORDER — MELOXICAM 15 MG PO TABS: 15.0000 mg | ORAL_TABLET | Freq: Every day | ORAL | 0 refills | Status: DC

## 2022-02-28 NOTE — Patient Instructions (Signed)
You have just been given a cortisone injection to reduce pain and inflammation. After the injection you may notice immediate relief of pain as a result of the Lidocaine. It is important to rest the area of the injection for 24 to 48 hours after the injection. There is a possibility of some temporary increased discomfort and swelling for up to 72 hours until the cortisone begins to work. If you do have pain, simply rest the joint and use ice. If you can tolerate over the counter medications, you can try Tylenol for added relief per package instructions. ?-Start prednisone, take for full course ?- After prednisone complete, start meloxicam daily (take with food) ?- Remain out of work x2 days ?- Obtain labs and x-ray ?- Return for follow-up in 2 weeks ?- Contact our office for any questions or concerns between now and then ?

## 2022-02-28 NOTE — Progress Notes (Signed)
?  ? ?Primary Care / Sports Medicine Office Visit ? ?Patient Information:  ?Patient ID: Nathaniel [name], male DOB: Apr 07, 1976 Age: 46 y.o. MRN: [id number]  ? ?Nathaniel [name] is a pleasant 46 y.o. male presenting with the following: ? ?Chief Complaint  ?Patient presents with  ? Foot Pain  ?  Left foot pain. Has had pain in right knee/foot then it starts feeling better then goes to left leg/left foot. Swelling and redness. Went to UC downstairs on 02/04/2022 left knee pain, was told gout, went to big toe, back up to back of left knee. Now it is hurting on outer part on left foot.   ? ? ?Vitals:  ? 02/28/22 0952  ?BP: 126/82  ?Pulse: 99  ?SpO2: 99%  ? ?Vitals:  ? 02/28/22 0952  ?Weight: 191 lb (86.6 kg)  ?Height: 5\' 6"  (1.676 m)  ? ?Body mass index is 30.83 kg/m?. ? ?No results found.  ? ?Independent interpretation of notes and tests performed by another provider:  ? ?None ? ?Procedures performed:  ? ?Procedure:  Injection of aspiration/injection left knee under ultrasound guidance. ?Ultrasound guidance utilized for in-plane approach to the superolateral left knee, synovial thickening and 2-3+ effusion noted, documentation and recording of effusion resolution is noted ?Samsung HS60 device utilized with permanent recording / reporting. ?Verbal informed consent obtained and verified. ?Skin prepped in a sterile fashion. ?Ethyl chloride for topical local analgesia.  ?Completed without difficulty and tolerated well. ?Aspirate: 33 mL cloudy inflammatory appearing yellowish fluid ?Medication: triamcinolone acetonide 40 mg/mL suspension for injection 1 mL total and 2 mL lidocaine 1% without epinephrine utilized for needle placement anesthetic ?Advised to contact for fevers/chills, erythema, induration, drainage, or persistent bleeding. ? ? ?Pertinent History, Exam, Impression, and Recommendations:  ? ?Effusion of left knee ?Patient with acute on chronic left knee pain, most recent episode ongoing for several weeks. Has noted  swelling, pain, and feels as the pain has traveled down to the left medial forefoot, focal about the 1st MTP. ? ?Examination shows 2-3+ effusion, painful near full ROM, tenderness about the medial patellar facet, joint lines, negative McMurray's, no laxity with A/P or valgus/varus stressing. ? 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 He did elect to proceed with aspiration of effusion followed by corticosteroid injection. The fluid will be sent for analysis and he will be on prednisone followed by meloxicam for comorbid foot symptoms. ? ?Chronic condition, acute exacerbation, Rx management ? ?Chronic foot pain, left ?Patient also presents with acute on chronic left medial forefoot pain with focality about the first MTP.  He is uncertain about a prior diagnosis of gout but does endorse some dietary intolerances (primarily alcohol) proceeding his symptoms.  He has had this treated in the past with steroids but has noted frequent recurrence, uncertain about lab test in the past. ? 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  Given the focal nature of his pain, etiology can include crystal arthropathy/gout, MTP osteoarthritis, possibly secondary/compensatory pain from comorbid left knee issues.  Plan for uric acid levels, dedicated x-ray, ideally placed on a course of prednisone followed by meloxicam and close follow-up in 2 weeks for reassessment.  I did discuss dietary modifications that he should utilize over the interim.  Pending labs and response, he may benefit  from allopurinol therapy.  Can reserve intra-articular cortisone for recalcitrant symptoms at return. ? ?Chronic condition, exacerbation, Rx management ?  ? ?Orders & Medications ?Meds ordered this  encounter  ?Medications  ? predniSONE (DELTASONE) 50 MG tablet  ?  Sig: Take 1 tablet (50 mg total) by mouth daily.  ?  Dispense:  5 tablet  ?  Refill:  0  ? meloxicam (MOBIC) 15 MG tablet  ?  Sig: Take 1 tablet (15 mg total) by mouth daily.  ?  Dispense:  30 tablet  ?  Refill:  0  ? triamcinolone acetonide (KENALOG-40) injection 40 mg  ? ?Orders Placed This Encounter  ?Procedures  ? Anaerobic and Aerobic Culture  ? Gram stain  ? DG Knee Complete 4 Views Left  ? DG Foot Complete Left  ? [place] LIMITED JOINT SPACE STRUCTURES LOW LEFT  ? Synovial fluid, crystal  ? Cell Count and Diff, Fluid, Other  ? Uric acid  ?  ? ?Return in about 2 weeks (around 03/14/2022).  ?  ? ?Jerrol Banana, MD ? ? Primary Care Sports Medicine ?[facility] ?[facility]  ? ?

## 2022-02-28 NOTE — Assessment & Plan Note (Signed)
Patient with acute on chronic left knee pain, most recent episode ongoing for several weeks. Has noted swelling, pain, and feels as the pain has traveled down to the left medial forefoot, focal about the 1st MTP. ? ?Examination shows 2-3+ effusion, painful near full ROM, tenderness about the medial patellar facet, joint lines, negative McMurray's, no laxity with A/P or valgus/varus stressing. ? 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 He did elect to proceed with aspiration of effusion followed by corticosteroid injection. The fluid will be sent for analysis and he will be on prednisone followed by meloxicam for comorbid foot symptoms. ? ?Chronic condition, acute exacerbation, Rx management ?

## 2022-02-28 NOTE — Assessment & Plan Note (Signed)
Patient also presents with acute on chronic left medial forefoot pain with focality about the first MTP.  He is uncertain about a prior diagnosis of gout but does endorse some dietary intolerances (primarily alcohol) proceeding his symptoms.  He has had this treated in the past with steroids but has noted frequent recurrence, uncertain about lab test in the past. ? 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  Given the focal nature of his pain, etiology can include crystal arthropathy/gout, MTP osteoarthritis, possibly secondary/compensatory pain from comorbid left knee issues.  Plan for uric acid levels, dedicated x-ray, ideally placed on a course of prednisone followed by meloxicam and close follow-up in 2 weeks for reassessment.  I did discuss dietary modifications that he should utilize over the interim.  Pending labs and response, he may benefit from allopurinol therapy.  Can reserve intra-articular cortisone for recalcitrant symptoms at return. ? ?Chronic condition, exacerbation, Rx management ? ?

## 2022-03-01 LAB — URIC ACID: Uric Acid: 7.2 mg/dL (ref 3.8–8.4)

## 2022-03-01 LAB — GRAM STAIN: Organism ID, Bacteria: NONE SEEN

## 2022-03-01 LAB — SPECIMEN STATUS REPORT

## 2022-03-01 NOTE — Progress Notes (Signed)
Called an added testing [id number] and [id number]. [facility] stated that they were un sure if they were able to do [id number]. Will fax over authorization if available.  ?

## 2022-03-14 ENCOUNTER — Encounter: Payer: Self-pay | Admitting: Family Medicine

## 2022-03-14 ENCOUNTER — Ambulatory Visit (INDEPENDENT_AMBULATORY_CARE_PROVIDER_SITE_OTHER): Payer: Self-pay | Admitting: Family Medicine

## 2022-03-14 VITALS — BP 122/82 | HR 97 | Ht 66.0 in | Wt 184.0 lb

## 2022-03-14 DIAGNOSIS — M1A09X0 Idiopathic chronic gout, multiple sites, without tophus (tophi): Secondary | ICD-10-CM

## 2022-03-14 MED ORDER — INDOMETHACIN 50 MG PO CAPS: 50.0000 mg | ORAL_CAPSULE | Freq: Two times a day (BID) | ORAL | 0 refills | Status: DC | PRN

## 2022-03-14 MED ORDER — ALLOPURINOL 100 MG PO TABS: 100.0000 mg | ORAL_TABLET | Freq: Every day | ORAL | 6 refills | Status: DC

## 2022-03-14 NOTE — Assessment & Plan Note (Signed)
Patient was for follow-up to chronic gout related pain affecting primarily his left knee and left foot/ankle.  At his last visit we aspirated his left knee, had patient start a course of prednisone, followed by meloxicam.  While he did note full resolution of left knee symptoms, foot/ankle symptoms were initially controlled with prednisone but then began to recur after prednisone completed and were not fully covered by meloxicam.  He did go to outside [facility] due to proximity, there he was started on indomethacin which has been helping his symptoms, though without resolution.  We had obtained labs at last visit which, while in normal range, given his history of gout show his uric acid to be elevated for gout patients. ? ?We discussed at length the nature of gout, relation to diet and other innate factors, chronicity of symptoms, treatments to date.  Plan is for patient to continue indomethacin on an as-needed basis, start allopurinol daily scheduled, and he would benefit from establishing with a PCP for continued monitoring and management of this issue.  I did offer her that a nutritionist would be beneficial in the future if symptoms persisted despite adherence to the after mentioned shared plan. ? ?Chronic condition, symptomatic, Rx management ?

## 2022-03-14 NOTE — Patient Instructions (Signed)
-   Start allopurinol, take once daily ?- Continue indomethacin twice daily as needed ?- Review information about gout eating plan ?- Recommend establishing care with primary care doctor ?- Follow-up as needed ?

## 2022-03-14 NOTE — Progress Notes (Signed)
?  ? ?Primary Care / Sports Medicine Office Visit ? ?Patient Information:  ?Patient ID: Nathaniel [name], male DOB: 04/18/1976 Age: 46 y.o. MRN: [id number]  ? ?Nathaniel [name] is a pleasant 46 y.o. male presenting with the following: ? ?Chief Complaint  ?Patient presents with  ? Effusion of left knee  ?  States is feeling better, does have some bruising   ? Foot Swelling  ?  Pt here with C/O left foot pain. Went to [facility] was given Gout medication and thinks it is getting better. Was given a steroid shot in arm to help with flare up  ? ? ?Vitals:  ? 03/14/22 0919  ?BP: 122/82  ?Pulse: 97  ?SpO2: 99%  ? ?Vitals:  ? 03/14/22 0919  ?Weight: 184 lb (83.5 kg)  ?Height: 5\' 6"  (1.676 m)  ? ?Body mass index is 29.7 kg/m?. ? ? LIMITED JOINT SPACE STRUCTURES LOW LEFT ? ?Result Date: 02/28/2022 ?Procedure:  Injection of aspiration/injection left knee under ultrasound guidance. Ultrasound guidance utilized for in-plane approach to the superolateral left knee, synovial thickening and 2-3+ effusion noted, documentation and recording of effusion resolution is noted Samsung HS60 device utilized with permanent recording / reporting. Verbal informed consent obtained and verified. Skin prepped in a sterile fashion. Ethyl chloride for topical local analgesia. Completed without difficulty and tolerated well. Aspirate: 33 mL cloudy inflammatory appearing yellowish fluid Medication: triamcinolone acetonide 40 mg/mL suspension for injection 1 mL total and 2 mL lidocaine 1% without epinephrine utilized for needle placement anesthetic Advised to contact for fevers/chills, erythema, induration, drainage, or persistent bleeding. ? ?DG Knee Complete 4 Views Left ? ?Result Date: 03/01/2022 ?CLINICAL DATA:  Chronic left knee pain with swelling related to a trauma accident 1 month ago. 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 EXAM: LEFT KNEE - COMPLETE 4+ VIEW COMPARISON:  None. FINDINGS: Normal alignment. No acute fracture. Normal  mineralization. The soft tissues are unremarkable. No joint effusion. IMPRESSION: Normal knee radiograph. No acute findings or significant arthropathy. Electronically Signed   By: 03/03/2022 M.D.   On: 03/01/2022 10:56  ? ?DG Foot Complete Left ? ?Result Date: 03/01/2022 ?CLINICAL DATA:  Acute and chronic left first metatarsophalangeal foot joint pain with swelling. Technologist note reports patient has gout intermittently over 10 years. 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 EXAM: LEFT FOOT - COMPLETE 3+ VIEW COMPARISON:  X-ray foot 10/26/2020. FINDINGS: Pes planus. No acute fracture. Normal mineralization. The soft tissues are unremarkable. IMPRESSION: Pes planus.  No acute findings or significant arthropathy. Electronically Signed   By: 10/28/2020 M.D.   On: 03/01/2022 10:59    ? ?Independent interpretation of notes and tests performed by another provider:  ? ?None ? ?Procedures performed:  ? ?None ? ?Pertinent History, Exam, Impression, and Recommendations:  ? ?Problem List Items Addressed This Visit   ? ?  ? Other  ? Idiopathic chronic gout of multiple sites without tophus - Primary  ?  Patient was for follow-up to chronic gout related pain affecting primarily his left knee and left foot/ankle.  At his last visit we aspirated his left knee, had patient start a course of prednisone, followed by meloxicam.  While he did note full resolution of left knee symptoms, foot/ankle symptoms were initially controlled with prednisone but then began to recur after prednisone completed and were not fully covered by meloxicam.  He did go to outside [facility] due to proximity, there he was started on indomethacin which has been helping his symptoms, though without  resolution.  We had obtained labs at last visit which, while in normal range, given his history of gout show his uric acid to be elevated for gout patients. ? ?We discussed at length the nature of gout, relation to diet and other innate factors,  chronicity of symptoms, treatments to date.  Plan is for patient to continue indomethacin on an as-needed basis, start allopurinol daily scheduled, and he would benefit from establishing with a PCP for continued monitoring and management of this issue.  I did offer her that a nutritionist would be beneficial in the future if symptoms persisted despite adherence to the after mentioned shared plan. ? ?Chronic condition, symptomatic, Rx management ? ?  ?  ? Relevant Medications  ? indomethacin (INDOCIN) 50 MG capsule  ? allopurinol (ZYLOPRIM) 100 MG tablet  ?  ? ?Orders & Medications ?Meds ordered this encounter  ?Medications  ? indomethacin (INDOCIN) 50 MG capsule  ?  Sig: Take 1 capsule (50 mg total) by mouth 2 (two) times daily as needed (gout pain).  ?  Dispense:  60 capsule  ?  Refill:  0  ? allopurinol (ZYLOPRIM) 100 MG tablet  ?  Sig: Take 1 tablet (100 mg total) by mouth daily.  ?  Dispense:  30 tablet  ?  Refill:  6  ? ?No orders of the defined types were placed in this encounter. ?  ? ?Return if symptoms worsen or fail to improve.  ?  ? ?Jerrol Banana, MD ? ? Primary Care Sports Medicine ?[facility] ?[facility]  ? ?

## 2022-03-17 LAB — BODY FLUID CULTURE AER/ANAER

## 2022-03-17 LAB — SPECIMEN STATUS REPORT

## 2022-03-17 LAB — RESULT

## 2022-04-09 ENCOUNTER — Encounter: Payer: Self-pay | Admitting: Emergency Medicine

## 2022-04-09 ENCOUNTER — Other Ambulatory Visit: Payer: Self-pay

## 2022-04-09 ENCOUNTER — Ambulatory Visit
Admission: EM | Admit: 2022-04-09 | Discharge: 2022-04-09 | Disposition: A | Discharge status: Home / Self Care | Payer: Self-pay | Attending: Physician Assistant | Admitting: Physician Assistant

## 2022-04-09 DIAGNOSIS — Z8739 Personal history of other diseases of the musculoskeletal system and connective tissue: Secondary | ICD-10-CM

## 2022-04-09 DIAGNOSIS — M25562 Pain in left knee: Secondary | ICD-10-CM

## 2022-04-09 DIAGNOSIS — M25462 Effusion, left knee: Secondary | ICD-10-CM

## 2022-04-09 DIAGNOSIS — M2550 Pain in unspecified joint: Secondary | ICD-10-CM

## 2022-04-09 MED ORDER — HYDROCODONE-ACETAMINOPHEN 5-325 MG PO TABS: 1.0000 | ORAL_TABLET | Freq: Four times a day (QID) | ORAL | 0 refills | Status: AC | PRN

## 2022-04-09 NOTE — ED Provider Notes (Signed)
[facility]    CSN: [id number] Arrival date & time: 04/09/22  0813      History   Chief Complaint Chief Complaint  Patient presents with   Foot Pain   Knee Pain    HPI Nathaniel [name] is a 46 y.o. male returning to [facility] urgent care for continued symptoms of left foot and left knee pain.  Patient has been following up with Dr. Ashley Royalty and has been referred to podiatry.  He has tried multiple medications in the past couple of months for these conditions.  He has tried prednisone multiple times, meloxicam, indomethacin, allopurinol, colchicine, and doxycycline.  He says none of these medications have helped and he continues to experience pain in his left great toe and left knee along with swelling of his left knee and significant pain of the left posterior knee.  Patient's appointment with the podiatrist is scheduled for May 02, 2022.  Patient says he needs something for the pain because nothing else has worked and his appointments not for a few more weeks.  He says he has significant pain when he tries to bear weight on the foot/lower extremity.  He denies any new injuries.  He does have a history of gout.  He has recently had numerous labs checked.  Being followed closely by Dr. Ashley Royalty and through [facility].  HPI  Past Medical History:  Diagnosis Date   Asthma    Gout     Patient Active Problem List   Diagnosis Date Noted   Idiopathic chronic gout of multiple sites without tophus 03/14/2022   Effusion of left knee 02/28/2022   Chronic foot pain, left 02/28/2022    Past Surgical History:  Procedure Laterality Date   NO PAST SURGERIES         Home Medications    Prior to Admission medications   Medication Sig Start Date End Date Taking? Authorizing Provider  HYDROcodone-acetaminophen (NORCO/VICODIN) 5-325 MG tablet Take 1 tablet by mouth every 6 (six) hours as needed for up to 5 days. 04/09/22 04/14/22 Yes Shirlee Latch, PA-C  albuterol (VENTOLIN HFA) 108 (90  Base) MCG/ACT inhaler Inhale into the lungs every 6 (six) hours as needed for wheezing or shortness of breath.    [provider]  allopurinol (ZYLOPRIM) 100 MG tablet Take 1 tablet (100 mg total) by mouth daily. 03/14/22   Jerrol Banana, MD  indomethacin (INDOCIN) 50 MG capsule Take 1 capsule (50 mg total) by mouth 2 (two) times daily as needed (gout pain). 03/14/22   Jerrol Banana, MD    Family History Family History  Problem Relation Age of Onset   Diabetes Mother    Heart disease Mother    COPD Mother     Social History Social History   Tobacco Use   Smoking status: Some Days    Types: Cigars   Smokeless tobacco: Never  Vaping Use   Vaping Use: Never used  Substance Use Topics   Alcohol use: Yes    Comment: rare   Drug use: Never     Allergies   Patient has no known allergies.   Review of Systems Review of Systems  Musculoskeletal:  Positive for arthralgias, gait problem and joint swelling.  Skin:  Positive for color change. Negative for wound.  Neurological:  Negative for weakness and numbness.    Physical Exam Triage Vital Signs ED Triage Vitals  Enc Vitals Group     BP 04/09/22 0827 109/83  Pulse Rate 04/09/22 0827 77     Resp 04/09/22 0827 15     Temp 04/09/22 0827 98 F (36.7 C)     Temp Source 04/09/22 0827 Oral     SpO2 04/09/22 0827 100 %     Weight 04/09/22 0824 182 lb 15.7 oz (83 kg)     Height 04/09/22 0824 5\' 6"  (1.676 m)     Head Circumference --      Peak Flow --      Pain Score 04/09/22 0824 8     Pain Loc --      Pain Edu? --      Excl. in GC? --    No data found.  Updated Vital Signs BP 109/83 (BP Location: Left Arm)   Pulse 77   Temp 98 F (36.7 C) (Oral)   Resp 15   Ht 5\' 6"  (1.676 m)   Wt 182 lb 15.7 oz (83 kg)   SpO2 100%   BMI 29.53 kg/m     Physical Exam Vitals and nursing note reviewed.  Constitutional:      General: He is not in acute distress.    Appearance: Normal appearance. He is  well-developed. He is not ill-appearing.  HENT:     Head: Normocephalic and atraumatic.  Eyes:     General: No scleral icterus.    Conjunctiva/sclera: Conjunctivae normal.  Cardiovascular:     Rate and Rhythm: Normal rate and regular rhythm.  Pulmonary:     Effort: Pulmonary effort is normal. No respiratory distress.     Breath sounds: Normal breath sounds.  Musculoskeletal:     Cervical back: Neck supple.     Comments: Left knee: Mild to moderate swelling of posterior knee with tenderness in this region.  Unable to fully extend knee due to pain.  Difficulty with knee flexion beyond 90 degrees due to pain and guarding.  Left foot: Tenderness to palpation of the entire left great toe but especially at the MTP joint.  Hyperpigmentation of the left great toe.  Significant onychomycosis of left great toe.  Skin:    General: Skin is warm and dry.     Capillary Refill: Capillary refill takes less than 2 seconds.  Neurological:     General: No focal deficit present.     Mental Status: He is alert. Mental status is at baseline.     Motor: No weakness.     Gait: Gait abnormal.  Psychiatric:        Mood and Affect: Mood normal.        Behavior: Behavior normal.        Thought Content: Thought content normal.     UC Treatments / Results  Labs (all labs ordered are listed, but only abnormal results are displayed) Labs Reviewed - No data to display  EKG   Radiology No results found.  Procedures Procedures (including critical care time)  Medications Ordered in UC Medications - No data to display  Initial Impression / Assessment and Plan / UC Course  I have reviewed the triage vital signs and the nursing notes.  Pertinent labs & imaging results that were available during my care of the patient were reviewed by me and considered in my medical decision making (see chart for details).  46 year old male returning to [facility] urgent care for continued left foot and left knee pain and  swelling for the past couple of months.  He has had this issue off and on for the past couple  of years and does have history of gout.  Has been following up with Dr. Ashley RoyaltyMatthews and has been referred to podiatry as well as internal medicine.  Patient has had lab work-up, imaging and joint aspiration of knee.  Has tried multiple medications including prednisone for multiple courses, multiple anti-inflammatory medicines, Tylenol, allopurinol, colchicine-all without improvement or relief of symptoms per patient.  However, according to some progress notes he does note some improvement when he takes prednisone at some times when the dose is higher.  Labs from 5/8:  Sed rate elevated at 56.  Hemoglobin decreased at 11.8.  Metabolic panel normal.  Uric acid normal at 5.9.  Patient was noted to have hematuria.  Referred to urology.  Advised patient to keep his follow up appointments.  Advised him there is no further test we can perform in urgent care and we will defer to the specialist.  At this time I did offer him pain medication so after reviewing controlled substance database I have sent Norco for him.  I did discuss with him that we cannot provide refills of this medication so I placed a referral to pain management for him.  Reviewed RICE guidelines.  Follow-up as needed.   Final Clinical Impressions(s) / UC Diagnoses   Final diagnoses:  Polyarthralgia  Pain and swelling of left knee  History of gout     Discharge Instructions      -As we discussed, we are limited in urgent care as to what we can do for your condition and that is why you are seeing orthopedics and have upcoming appointment with podiatrist. - You have already tried numerous medications that have not helped so the only thing I can really do is give you something for pain but as discussed, we cannot continue to provide refills of pain medication.  Is able to place a referral to pain management for you. -Keep appointment with podiatrist  in June. - Ice and elevate the foot.     ED Prescriptions     Medication Sig Dispense Auth. Provider   HYDROcodone-acetaminophen (NORCO/VICODIN) 5-325 MG tablet Take 1 tablet by mouth every 6 (six) hours as needed for up to 5 days. 20 tablet Shirlee LatchEaves, Samil Mecham B, PA-C      I have reviewed the PDMP during this encounter.   Shirlee Latchaves, Shihab States B, PA-C 04/09/22 [phone]

## 2022-04-09 NOTE — ED Triage Notes (Signed)
Patient c/o left foot pain and swelling for a month.  Patient reports history of gout.  Patient c/o left knee pain and swelling at the back of the knee that started Thursday night.  Patient denies recent fall or injury.

## 2022-04-09 NOTE — Discharge Instructions (Addendum)
-  As we discussed, we are limited in urgent care as to what we can do for your condition and that is why you are seeing orthopedics and have upcoming appointment with podiatrist. - You have already tried numerous medications that have not helped so the only thing I can really do is give you something for pain but as discussed, we cannot continue to provide refills of pain medication.  Is able to place a referral to pain management for you. -Keep appointment with podiatrist in June. - Ice and elevate the foot.

## 2022-05-13 ENCOUNTER — Ambulatory Visit: Payer: Self-pay | Admitting: Urology

## 2023-01-11 ENCOUNTER — Encounter: Payer: Self-pay | Admitting: Emergency Medicine

## 2023-01-11 ENCOUNTER — Ambulatory Visit
Admission: EM | Admit: 2023-01-11 | Discharge: 2023-01-11 | Disposition: A | Discharge status: Home / Self Care | Payer: Self-pay | Attending: Emergency Medicine | Admitting: Emergency Medicine

## 2023-01-11 DIAGNOSIS — M109 Gout, unspecified: Secondary | ICD-10-CM

## 2023-01-11 MED ORDER — ALLOPURINOL 100 MG PO TABS: 100.0000 mg | ORAL_TABLET | Freq: Every day | ORAL | 0 refills | Status: DC

## 2023-01-11 MED ORDER — PREDNISONE 10 MG (21) PO TBPK: ORAL_TABLET | ORAL | 0 refills | Status: DC

## 2023-01-11 MED ORDER — DEXAMETHASONE SODIUM PHOSPHATE 10 MG/ML IJ SOLN
10.0000 mg | Freq: Once | INTRAMUSCULAR | Status: AC
  Administered 2023-01-11: 10 mg via INTRAMUSCULAR

## 2023-01-11 NOTE — Discharge Instructions (Addendum)
Take the prednisone according to the package instructions to help decrease inflammation and help with pain.  Follow the low purine diet guide given your discharge paperwork.  This is a list of foods that could be contributing to your gout attacks.  Red meat, alcohol, poor water intake, and leafy green vegetables are the most common contributors to gout flares.  Increase your oral fluid intake of water to at least 128 ounces to help your body flush the uric acid from your system.  Start taking the allopurinol after you have finished the prednisone for your gout attack.  We have help to establish a primary care at this visit.  Please keep the appointment so that you have someone going forward who can help you manage your gout in the future.

## 2023-01-11 NOTE — ED Triage Notes (Addendum)
Pt presents with right foot pain and swelling x 2 days. His symptoms are worse at night.

## 2023-01-11 NOTE — ED Provider Notes (Signed)
[facility]    CSN: [id number] Arrival date & time: 01/11/23  [id number]      History   Chief Complaint Chief Complaint  Patient presents with   Foot Pain    HPI Nathaniel [name] is a 47 y.o. male.   HPI  47 year old male here for evaluation of right foot pain and swelling.  The patient reports that he is been experiencing swelling and pain with some redness to the right foot on the midfoot and side of the foot for the past 2 days.  This is similar to his previous gout flares.  He was prescribed allopurinol as a preventative but he ran out of refills and does not currently have a primary care provider.  He states that he has not been drinking a lot of water and has been eating a lot of red meat.  Past Medical History:  Diagnosis Date   Asthma    Gout     Patient Active Problem List   Diagnosis Date Noted   Idiopathic chronic gout of multiple sites without tophus 03/14/2022   Effusion of left knee 02/28/2022   Chronic foot pain, left 02/28/2022    Past Surgical History:  Procedure Laterality Date   NO PAST SURGERIES         Home Medications    Prior to Admission medications   Medication Sig Start Date End Date Taking? Authorizing Provider  allopurinol (ZYLOPRIM) 100 MG tablet Take 1 tablet (100 mg total) by mouth daily. 01/11/23 04/11/23 Yes Margarette Canada, NP  predniSONE (STERAPRED UNI-PAK 21 TAB) 10 MG (21) TBPK tablet Take 6 tablets on day 1, 5 tablets day 2, 4 tablets day 3, 3 tablets day 4, 2 tablets day 5, 1 tablet day 6 01/11/23  Yes Margarette Canada, NP  albuterol (VENTOLIN HFA) 108 (90 Base) MCG/ACT inhaler Inhale into the lungs every 6 (six) hours as needed for wheezing or shortness of breath.    [provider]    Family History Family History  Problem Relation Age of Onset   Diabetes Mother    Heart disease Mother    COPD Mother     Social History Social History   Tobacco Use   Smoking status: Some Days    Types: Cigars   Smokeless  tobacco: Never  Vaping Use   Vaping Use: Never used  Substance Use Topics   Alcohol use: Yes    Comment: rare   Drug use: Never     Allergies   Patient has no known allergies.   Review of Systems Review of Systems  Constitutional:  Negative for fever.  Musculoskeletal:  Positive for arthralgias and myalgias.  Skin:  Positive for color change.     Physical Exam Triage Vital Signs ED Triage Vitals  Enc Vitals Group     BP 01/11/23 1121 (!) 140/103     Pulse Rate 01/11/23 1121 80     Resp 01/11/23 1121 16     Temp 01/11/23 1121 98.4 F (36.9 C)     Temp Source 01/11/23 1121 Oral     SpO2 01/11/23 1121 100 %     Weight --      Height --      Head Circumference --      Peak Flow --      Pain Score 01/11/23 1119 2     Pain Loc --      Pain Edu? --      Excl. in [place]? --  No data found.  Updated Vital Signs BP (!) 140/103 (BP Location: Left Arm)   Pulse 80   Temp 98.4 F (36.9 C) (Oral)   Resp 16   SpO2 100%   Visual Acuity Right Eye Distance:   Left Eye Distance:   Bilateral Distance:    Right Eye Near:   Left Eye Near:    Bilateral Near:     Physical Exam Vitals and nursing note reviewed.  Constitutional:      Appearance: Normal appearance. He is not ill-appearing.  HENT:     Head: Normocephalic and atraumatic.  Musculoskeletal:        General: Swelling and tenderness present. No deformity or signs of injury. Normal range of motion.  Skin:    General: Skin is warm and dry.     Capillary Refill: Capillary refill takes less than 2 seconds.     Findings: Erythema present.  Neurological:     General: No focal deficit present.     Mental Status: He is alert and oriented to person, place, and time.      UC Treatments / Results  Labs (all labs ordered are listed, but only abnormal results are displayed) Labs Reviewed - No data to display  EKG   Radiology No results found.  Procedures Procedures (including critical care  time)  Medications Ordered in UC Medications  dexamethasone (DECADRON) injection 10 mg (has no administration in time range)    Initial Impression / Assessment and Plan / UC Course  I have reviewed the triage vital signs and the nursing notes.  Pertinent labs & imaging results that were available during my care of the patient were reviewed by me and considered in my medical decision making (see chart for details).   Patient is a pleasant, nontoxic-appearing 47 year old male who has a significant past medical history of gout and asthma but is not currently under any physicians care.  He has taken allopurinol in the past to prevent gout flares but he ran out of refills and does not have a PCP.  I will order to the nursing staff to establish a PCP visit for him prior to discharge.  Patient was seen image above there is some erythema and edema to the dorsal lateral midfoot that is mildly warm and tender.  I suspect the patient is undergoing a gout flare.  I will administer 10 mg of Decadron IM in clinic and discharged home on a prednisone pack.  I will also refill his allopurinol to take following completion of the prednisone to help prevent further gout attacks.  I also advised him to cut back on his red meat and to increase his oral fluid intake of water so it helps flush his system and helps his body clear the uric acid.  Final Clinical Impressions(s) / UC Diagnoses   Final diagnoses:  Acute gout of right foot, unspecified cause     Discharge Instructions      Take the prednisone according to the package instructions to help decrease inflammation and help with pain.  Follow the low purine diet guide given your discharge paperwork.  This is a list of foods that could be contributing to your gout attacks.  Red meat, alcohol, poor water intake, and leafy green vegetables are the most common contributors to gout flares.  Increase your oral fluid intake of water to at least 128 ounces to help  your body flush the uric acid from your system.  Start taking the allopurinol after you have  finished the prednisone for your gout attack.  We have help to establish a primary care at this visit.  Please keep the appointment so that you have someone going forward who can help you manage your gout in the future.     ED Prescriptions     Medication Sig Dispense Auth. Provider   predniSONE (STERAPRED UNI-PAK 21 TAB) 10 MG (21) TBPK tablet Take 6 tablets on day 1, 5 tablets day 2, 4 tablets day 3, 3 tablets day 4, 2 tablets day 5, 1 tablet day 6 21 tablet Margarette Canada, NP   allopurinol (ZYLOPRIM) 100 MG tablet Take 1 tablet (100 mg total) by mouth daily. 90 tablet Margarette Canada, NP      PDMP not reviewed this encounter.   Margarette Canada, NP 01/11/23 1154

## 2023-11-20 ENCOUNTER — Ambulatory Visit
Admission: EM | Admit: 2023-11-20 | Discharge: 2023-11-20 | Disposition: A | Discharge status: Home / Self Care | Payer: Self-pay | Attending: Family Medicine | Admitting: Family Medicine

## 2023-11-20 ENCOUNTER — Encounter: Payer: Self-pay | Admitting: Emergency Medicine

## 2023-11-20 DIAGNOSIS — M10472 Other secondary gout, left ankle and foot: Secondary | ICD-10-CM

## 2023-11-20 MED ORDER — COLCHICINE 0.6 MG PO TABS: ORAL_TABLET | ORAL | 0 refills | Status: DC

## 2023-11-20 MED ORDER — DEXAMETHASONE SODIUM PHOSPHATE 10 MG/ML IJ SOLN
10.0000 mg | Freq: Once | INTRAMUSCULAR | Status: AC
  Administered 2023-11-20: 10 mg via INTRAMUSCULAR

## 2023-11-20 MED ORDER — PREDNISONE 10 MG (21) PO TBPK: ORAL_TABLET | Freq: Every day | ORAL | 0 refills | Status: DC

## 2023-11-20 NOTE — ED Triage Notes (Signed)
 Pt presents with left ankle pain and swelling that started today. Pt denies any injury.

## 2023-11-20 NOTE — ED Provider Notes (Signed)
 [facility]    CSN: [id number] Arrival date & time: 11/20/23  1616      History   Chief Complaint Chief Complaint  Patient presents with   Ankle Pain    HPI  HPI Nathaniel [name] is a 48 y.o. male.   Nathaniel [name] presents for left ankle pain and swelling that started today.  It hurts to touch even when putting his socks on.  Has some swelling and redness to the area.  No known injury, trauma, and falls. Took ibuprofen  this morning which helped somewhat. Pain 10/10 with his shoes off.  Has history of gout and is no longer taking allopurinol .  He drank over the new year.  Denies any recent red meat, seafood or wine.  Does eat hard cheeses.     Past Medical History:  Diagnosis Date   Asthma    Gout     Patient Active Problem List   Diagnosis Date Noted   Idiopathic chronic gout of multiple sites without tophus 03/14/2022   Effusion of left knee 02/28/2022   Chronic foot pain, left 02/28/2022    Past Surgical History:  Procedure Laterality Date   NO PAST SURGERIES         Home Medications    Prior to Admission medications   Medication Sig Start Date End Date Taking? Authorizing Provider  colchicine  0.6 MG tablet Take 1.2 mg (two 0.6-mg tablets) followed by 0.6 mg (1 tablet) one hour later; then 1 tablet daily until complete 11/20/23  Yes Labresha Mellor, DO  predniSONE  (STERAPRED UNI-PAK 21 TAB) 10 MG (21) TBPK tablet Take by mouth daily. Take 6 tabs by mouth daily for 1, then 5 tabs for 1 day, then 4 tabs for 1 day, then 3 tabs for 1 day, then 2 tabs for 1 day, then 1 tab for 1 day. 11/21/23  Yes Ethelean Colla, DO  albuterol (VENTOLIN HFA) 108 (90 Base) MCG/ACT inhaler Inhale into the lungs every 6 (six) hours as needed for wheezing or shortness of breath.    [provider]  allopurinol  (ZYLOPRIM ) 100 MG tablet Take 1 tablet (100 mg total) by mouth daily. 01/11/23 04/11/23  Bernardino Ditch, NP    Family History Family History  Problem Relation Age of Onset    Diabetes Mother    Heart disease Mother    COPD Mother     Social History Social History   Tobacco Use   Smoking status: Some Days    Types: Cigars   Smokeless tobacco: Never  Vaping Use   Vaping status: Never Used  Substance Use Topics   Alcohol use: Yes    Comment: rare   Drug use: Never     Allergies   Patient has no known allergies.   Review of Systems Review of Systems: :negative unless otherwise stated in HPI.      Physical Exam Triage Vital Signs ED Triage Vitals  Encounter Vitals Group     BP 11/20/23 1726 (!) 154/92     Systolic BP Percentile --      Diastolic BP Percentile --      Pulse Rate 11/20/23 1726 (!) 104     Resp 11/20/23 1726 18     Temp 11/20/23 1726 98.9 F (37.2 C)     Temp Source 11/20/23 1726 Oral     SpO2 11/20/23 1726 96 %     Weight --      Height --      Head Circumference --  Peak Flow --      Pain Score 11/20/23 1724 7     Pain Loc --      Pain Education --      Exclude from Growth Chart --    No data found.  Updated Vital Signs BP (!) 154/92 (BP Location: Right Arm)   Pulse (!) 104   Temp 98.9 F (37.2 C) (Oral)   Resp 18   SpO2 96%   Visual Acuity Right Eye Distance:   Left Eye Distance:   Bilateral Distance:    Right Eye Near:   Left Eye Near:    Bilateral Near:     Physical Exam GEN: well appearing male in no acute distress  CVS: well perfused, tachycardic RESP: speaking in full sentences without pause, no respiratory distress  MSK: left  ankle: Inspection: +erythema, edema, ecchymosis or bony deformity, no bone pes planus or cavus deformity, transverse and medial arches intact Palpation: Medial malleolus tenderness to light palpation, warmth and redness in this area with some palpable edema ROM: Full active and passive range of motion Strength: 5/5 in all directions No ligamentous laxity No pain at the base of the fifth metatarsal Able to ambulate  without pain  Special Tests: Anterior and  posterior drawer: Negative Neurovascularly intact, no instability noted    UC Treatments / Results  Labs (all labs ordered are listed, but only abnormal results are displayed) Labs Reviewed - No data to display  EKG   Radiology No results found.    Procedures Procedures (including critical care time)  Medications Ordered in UC Medications  dexamethasone  (DECADRON ) injection 10 mg (10 mg Intramuscular Given 11/20/23 1806)    Initial Impression / Assessment and Plan / UC Course  I have reviewed the triage vital signs and the nursing notes.  Pertinent labs & imaging results that were available during my care of the patient were reviewed by me and considered in my medical decision making (see chart for details).      Pt is a 48 y.o.  male with acute on chronic gout flare.  On exam, pt has tenderness at medial malleolus he has no known injury however.  Imaging deferred as he has not well-documented history of gout without recent use of allopurinol .  He admits to drinking some alcohol for the new year.  On chart review, on 10/26/2020 his uric acid was 10.  I am highly suspicious for another gout flare.  Ankle imaging deferred.  Recommended Decadron  injection he is agreeable.  Patient given 10 mg IM Decadron .  Patient to gradually return to normal activities, as tolerated and continue ordinary activities within the limits permitted by pain. Prescribed prednisone  taper and colchicine  for pain relief.  Tylenol  PRN.   Patient to follow up with orthopedic provider, if symptoms do not improve with conservative treatment.  Return and ED precautions given. Understanding voiced. Discussed MDM, treatment plan and plan for follow-up with patient who agrees with plan.   Final Clinical Impressions(s) / UC Diagnoses   Final diagnoses:  Other secondary acute gout of left ankle     Discharge Instructions      Stop by the pharmacy to pick up your prescriptions.  Follow up with your primary  care provider as needed.'      ED Prescriptions     Medication Sig Dispense Auth. Provider   predniSONE  (STERAPRED UNI-PAK 21 TAB) 10 MG (21) TBPK tablet Take by mouth daily. Take 6 tabs by mouth daily for 1, then 5  tabs for 1 day, then 4 tabs for 1 day, then 3 tabs for 1 day, then 2 tabs for 1 day, then 1 tab for 1 day. 21 tablet Amoni Morales, DO   colchicine  0.6 MG tablet Take 1.2 mg (two 0.6-mg tablets) followed by 0.6 mg (1 tablet) one hour later; then 1 tablet daily until complete 9 tablet Trygve Thal, DO      PDMP not reviewed this encounter.   Kriste Berth, DO 11/24/23 1522

## 2023-11-20 NOTE — Discharge Instructions (Addendum)
 Stop by the pharmacy to pick up your prescriptions.  Follow up with your primary care provider as needed.

## 2024-03-17 ENCOUNTER — Encounter: Payer: Self-pay | Admitting: Emergency Medicine

## 2024-03-17 ENCOUNTER — Ambulatory Visit
Admission: EM | Admit: 2024-03-17 | Discharge: 2024-03-17 | Disposition: A | Discharge status: Home / Self Care | Payer: Self-pay | Attending: Family Medicine | Admitting: Family Medicine

## 2024-03-17 DIAGNOSIS — M109 Gout, unspecified: Secondary | ICD-10-CM

## 2024-03-17 DIAGNOSIS — M1A09X0 Idiopathic chronic gout, multiple sites, without tophus (tophi): Secondary | ICD-10-CM

## 2024-03-17 MED ORDER — PREDNISONE 10 MG (21) PO TBPK: ORAL_TABLET | Freq: Every day | ORAL | 0 refills | Status: DC

## 2024-03-17 MED ORDER — DEXAMETHASONE SODIUM PHOSPHATE 10 MG/ML IJ SOLN
10.0000 mg | Freq: Once | INTRAMUSCULAR | Status: AC
  Administered 2024-03-17: 10 mg via INTRAMUSCULAR

## 2024-03-17 MED ORDER — COLCHICINE 0.6 MG PO TABS: ORAL_TABLET | ORAL | 0 refills | Status: DC

## 2024-03-17 NOTE — ED Triage Notes (Addendum)
 Pt c/o right elbow pain and swelling. Started about 4 days ago. The area is warm to the touch. He has been using ice and taking ibuprofen  without relief. Denies fever. No known injury.  Pt has h/o gout. He states he feels similar to gout.

## 2024-03-17 NOTE — Discharge Instructions (Addendum)
 Start your prednisone  tomorrow.  Take 2 of your colchicine  today.  Speak with your primary care doctor about restarting your allopurinol  after your joint pain has stopped.  Stop by the pharmacy to pick up your prescriptions.  Follow up with your primary care provider or return to the urgent care, if not improving.

## 2024-03-17 NOTE — ED Provider Notes (Signed)
 [facility]    CSN: [id number] Arrival date & time: 03/17/24  1405      History   Chief Complaint Chief Complaint  Patient presents with   Joint Swelling    Right elbow    HPI  HPI Nathaniel [name] is a 48 y.o. male.   Nathaniel [name] presents for righ elbow pain for the past 4 days. Tried wearing a brace for support but no its hurting to much to wear it now.  He is not able to bend the right elbow all the way due to the swelling and pain. Tried warm compresses and some ibuprofen  but didn't help.   Has left elbow pain but its not as bad. Feels like another gout flare.   He has been drinking wine and eat at some fish. Tried tart juice in the past but stopped this as he doesn't like the taste of cherries. He stopped taking the allopurinol .    Past Medical History:  Diagnosis Date   Asthma    Gout     Patient Active Problem List   Diagnosis Date Noted   Idiopathic chronic gout of multiple sites without tophus 03/14/2022   Effusion of left knee 02/28/2022   Chronic foot pain, left 02/28/2022    Past Surgical History:  Procedure Laterality Date   NO PAST SURGERIES         Home Medications    Prior to Admission medications   Medication Sig Start Date End Date Taking? Authorizing Provider  albuterol (VENTOLIN HFA) 108 (90 Base) MCG/ACT inhaler Inhale into the lungs every 6 (six) hours as needed for wheezing or shortness of breath.    [provider]  allopurinol  (ZYLOPRIM ) 100 MG tablet Take 1 tablet (100 mg total) by mouth daily. 01/11/23 04/11/23  Kent Pear, NP  colchicine  0.6 MG tablet Take 1.2 mg (two 0.6-mg tablets) followed by 0.6 mg (1 tablet) one hour later; then 1 tablet daily until complete 03/17/24   Keyara Ent, DO  predniSONE  (STERAPRED UNI-PAK 21 TAB) 10 MG (21) TBPK tablet Take by mouth daily. Take 6 tabs by mouth daily for 1, then 5 tabs for 1 day, then 4 tabs for 1 day, then 3 tabs for 1 day, then 2 tabs for 1 day, then 1 tab for 1 day.  03/18/24   Jahdiel Krol, DO    Family History Family History  Problem Relation Age of Onset   Diabetes Mother    Heart disease Mother    COPD Mother     Social History Social History   Tobacco Use   Smoking status: Some Days    Types: Cigars   Smokeless tobacco: Never  Vaping Use   Vaping status: Never Used  Substance Use Topics   Alcohol use: Yes    Comment: rare   Drug use: Never     Allergies   Patient has no known allergies.   Review of Systems Review of Systems: :negative unless otherwise stated in HPI.      Physical Exam Triage Vital Signs ED Triage Vitals  Encounter Vitals Group     BP      Systolic BP Percentile      Diastolic BP Percentile      Pulse      Resp      Temp      Temp src      SpO2      Weight      Height      Head Circumference  Peak Flow      Pain Score      Pain Loc      Pain Education      Exclude from Growth Chart    No data found.  Updated Vital Signs BP (!) 130/93 (BP Location: Left Arm)   Pulse 95   Temp 98.8 F (37.1 C) (Oral)   Resp 16   Ht 5\' 6"  (1.676 m)   Wt 83 kg   SpO2 98%   BMI 29.53 kg/m   Visual Acuity Right Eye Distance:   Left Eye Distance:   Bilateral Distance:    Right Eye Near:   Left Eye Near:    Bilateral Near:     Physical Exam GEN: well appearing male in no acute distress  CVS: well perfused  RESP: speaking in full sentences without pause, no respiratory distress  MSK:   Right elbow Erythema, warmth and moderate edema No evidence of bony deformity, asymmetry, or muscle atrophy. Tenderness over the bilateral epicondyles and olecranon Limited active range of motion secondary to pain and swelling Strength 5/5 grip, elbow and shoulder.  Sensation intact. Peripheral pulses intact.   UC Treatments / Results  Labs (all labs ordered are listed, but only abnormal results are displayed) Labs Reviewed - No data to display  EKG   Radiology No results  found.   Procedures Procedures (including critical care time)  Medications Ordered in UC Medications  dexamethasone  (DECADRON ) injection 10 mg (10 mg Intramuscular Given 03/17/24 1439)    Initial Impression / Assessment and Plan / UC Course  I have reviewed the triage vital signs and the nursing notes.  Pertinent labs & imaging results that were available during my care of the patient were reviewed by me and considered in my medical decision making (see chart for details).      Pt is a 48 y.o.  male with acute on chronic gout flare.  On exam, pt has tenderness at olecranon, bilateral epicondyles of the right elbow.  Imaging deferred as he has well-documented history of gout without recent use of allopurinol .  He admits to drinking some alcohol and eating fish.  On chart review, on 10/26/2020 his uric acid was 10.  I am highly suspicious for another gout flare.  Recommended Decadron  injection he is agreeable.  Patient given 10 mg IM Decadron .   Patient to gradually return to normal activities, as tolerated and continue ordinary activities within the limits permitted by pain. Prescribed prednisone  taper and colchicine  for pain relief.  Tylenol  PRN.    Patient to follow up with orthopedic provider, if symptoms do not improve with conservative treatment.  Return and ED precautions given. Understanding voiced. Discussed MDM, treatment plan and plan for follow-up with patient who agrees with plan.  Final Clinical Impressions(s) / UC Diagnoses   Final diagnoses:  Acute gout of right elbow, unspecified cause  Chronic gout of multiple sites, unspecified cause     Discharge Instructions      Start your prednisone  tomorrow.  Take 2 of your colchicine  today.  Speak with your primary care doctor about restarting your allopurinol  after your joint pain has stopped.  Stop by the pharmacy to pick up your prescriptions.  Follow up with your primary care provider or return to the urgent care, if not  improving.       ED Prescriptions     Medication Sig Dispense Auth. Provider   predniSONE  (STERAPRED UNI-PAK 21 TAB) 10 MG (21) TBPK tablet Take by mouth  daily. Take 6 tabs by mouth daily for 1, then 5 tabs for 1 day, then 4 tabs for 1 day, then 3 tabs for 1 day, then 2 tabs for 1 day, then 1 tab for 1 day. 21 tablet Jori Thrall, DO   colchicine  0.6 MG tablet Take 1.2 mg (two 0.6-mg tablets) followed by 0.6 mg (1 tablet) one hour later; then 1 tablet daily until complete 9 tablet Timoteo Carreiro, DO      PDMP not reviewed this encounter.   Isolde Skaff, DO 03/17/24 1453

## 2024-04-14 IMAGING — CR DG FOOT COMPLETE 3+V*L*
3 series · 3 of 3 positions shown · non-contrast
Comparison: X-ray foot 10/26/2020.

CLINICAL DATA: Acute and chronic left first metatarsophalangeal
foot joint pain with swelling. Technologist note reports patient has
gout intermittently over 10 years. Clinical concern for [HOSPITAL]
arthropathy.

EXAM:
LEFT FOOT - COMPLETE 3+ VIEW

[foot ap]
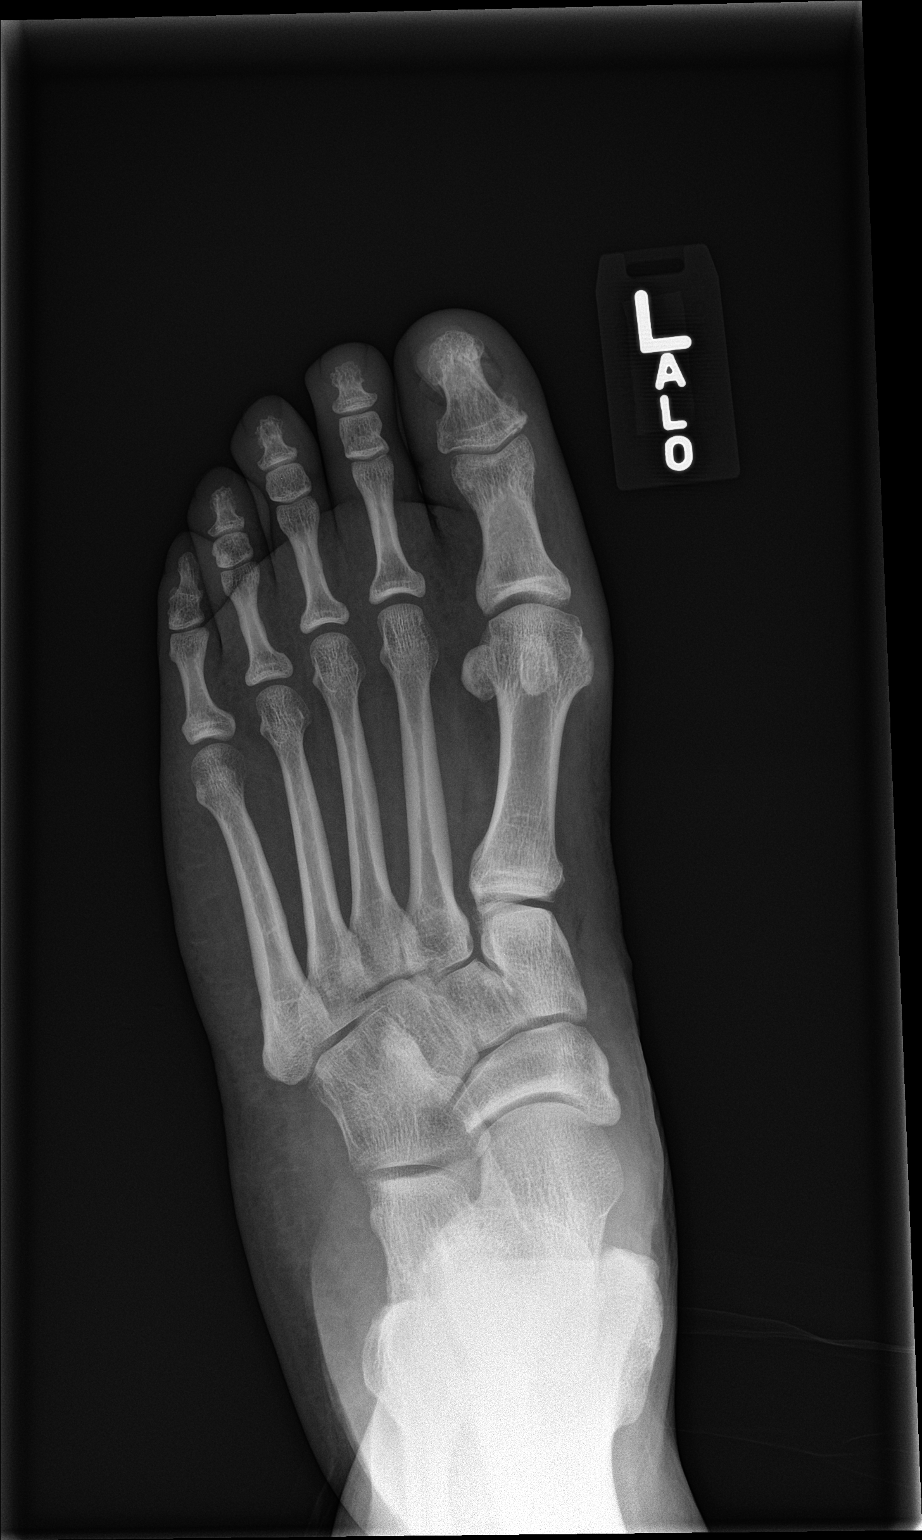

[foot obl]
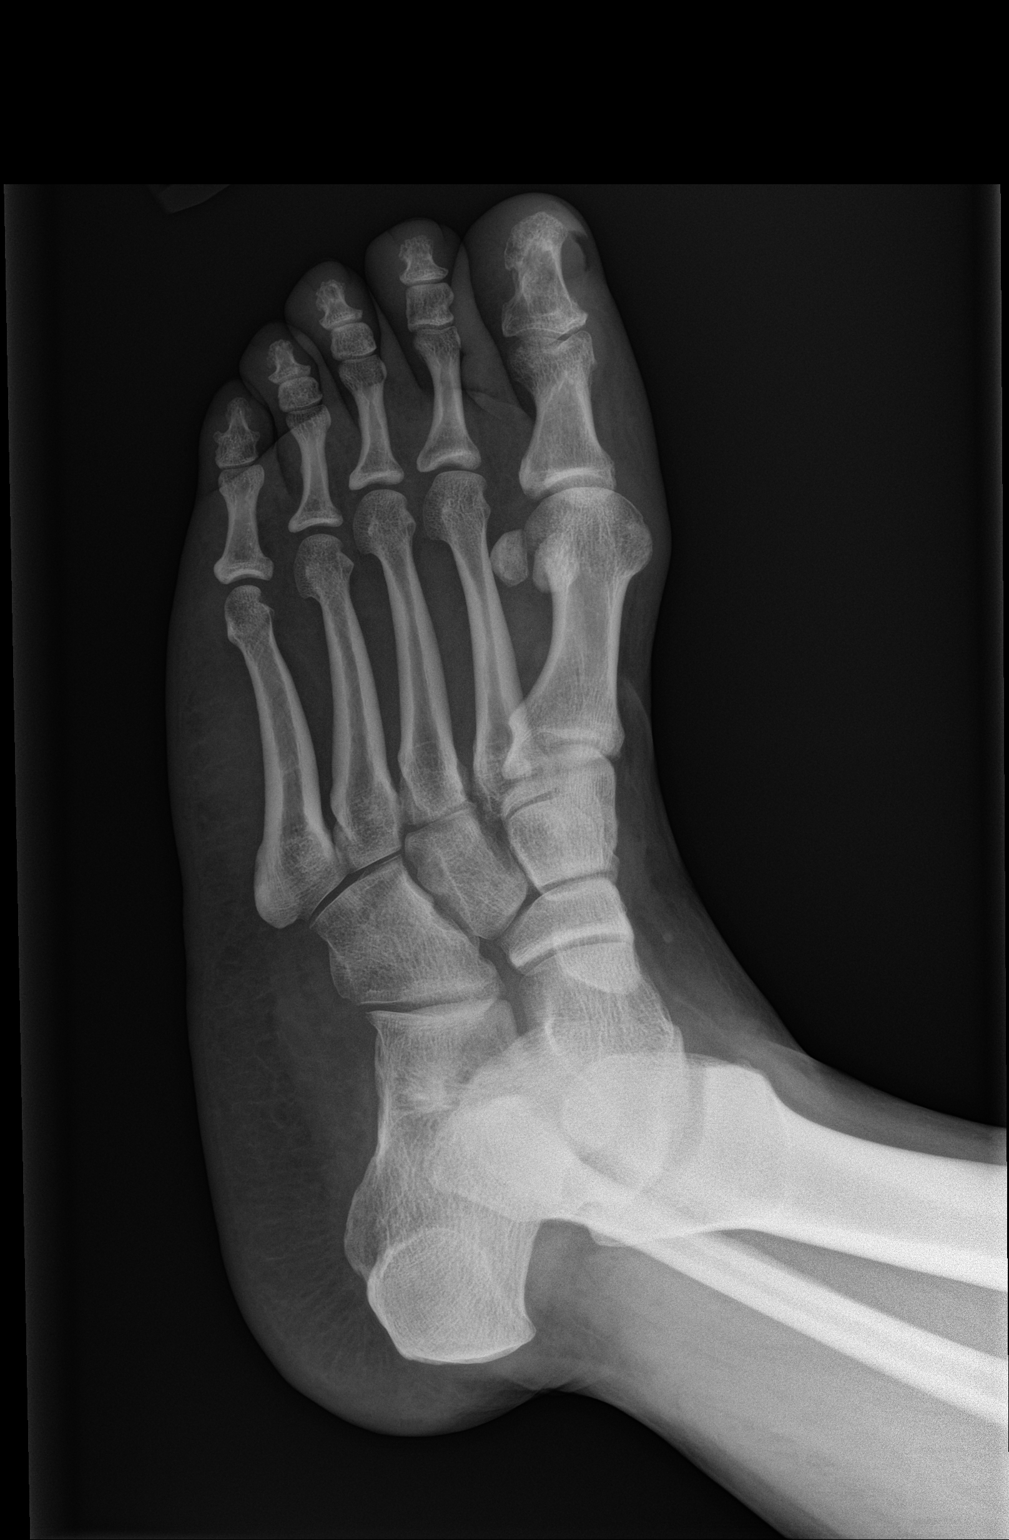

[foot lat]
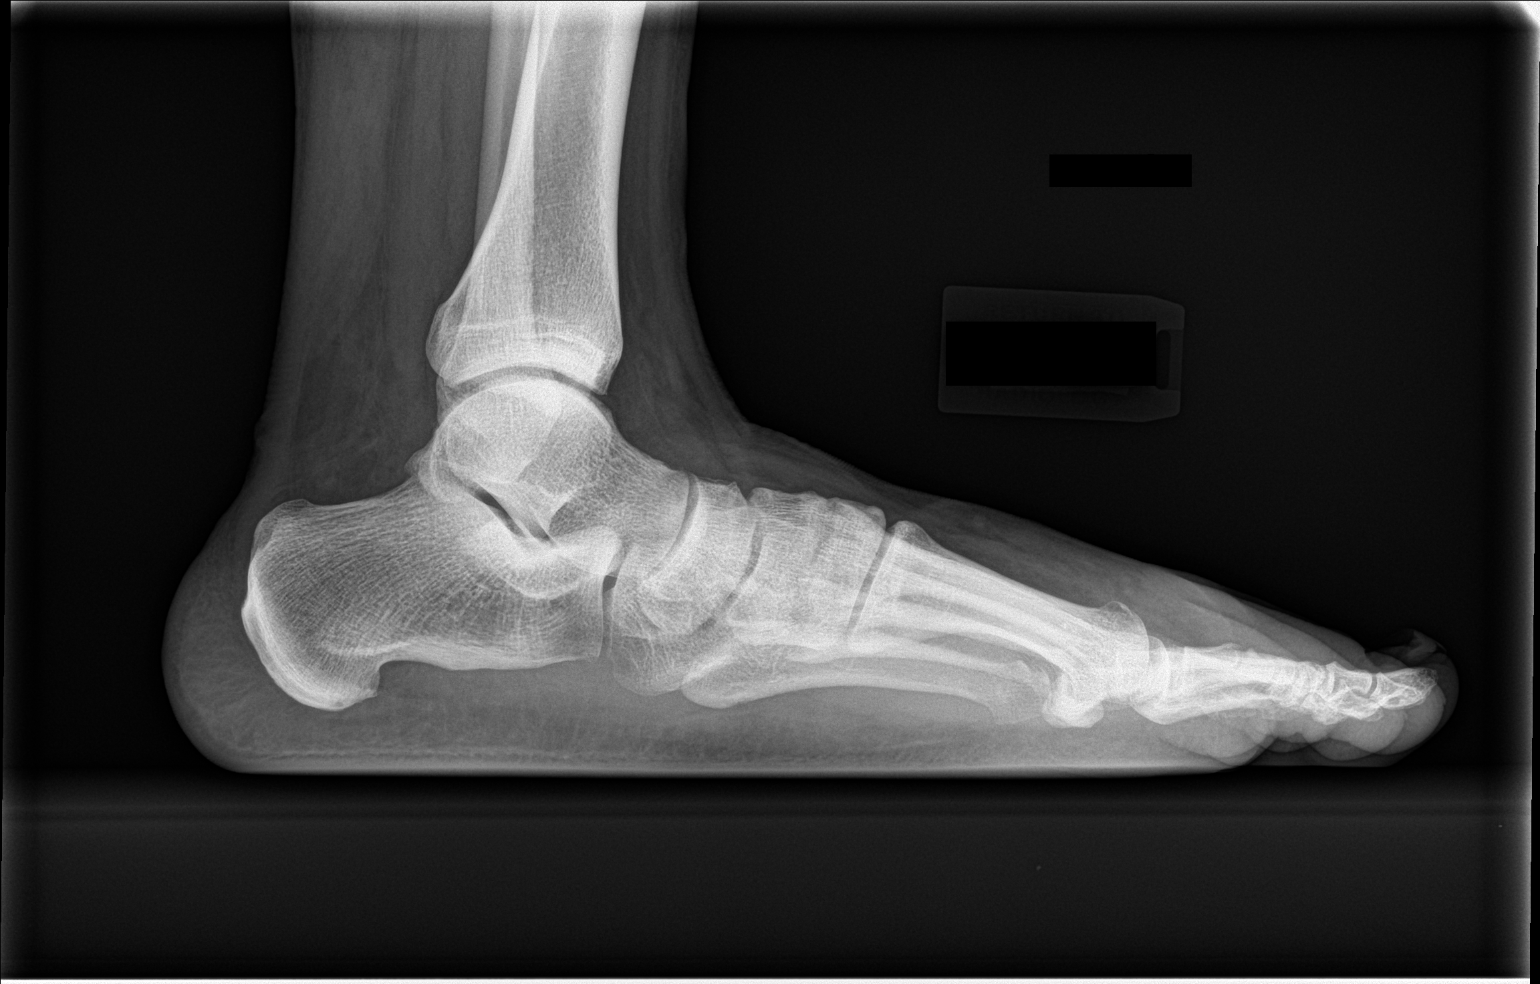

[3 of 3 positions shown; findings below may reference images not displayed]

FINDINGS: Pes planus. No acute fracture. Normal mineralization. The soft
tissues are unremarkable.
IMPRESSION: Pes planus.  No acute findings or significant arthropathy.

## 2024-04-14 IMAGING — CR DG KNEE COMPLETE 4+V*L*
4 series · 4 of 4 positions shown · non-contrast
Comparison: None.

CLINICAL DATA: Chronic left knee pain with swelling related to a
trauma accident 1 month ago. Clinical concern for [HOSPITAL]
arthropathy.

EXAM:
LEFT KNEE - COMPLETE 4+ VIEW

[knee ap]
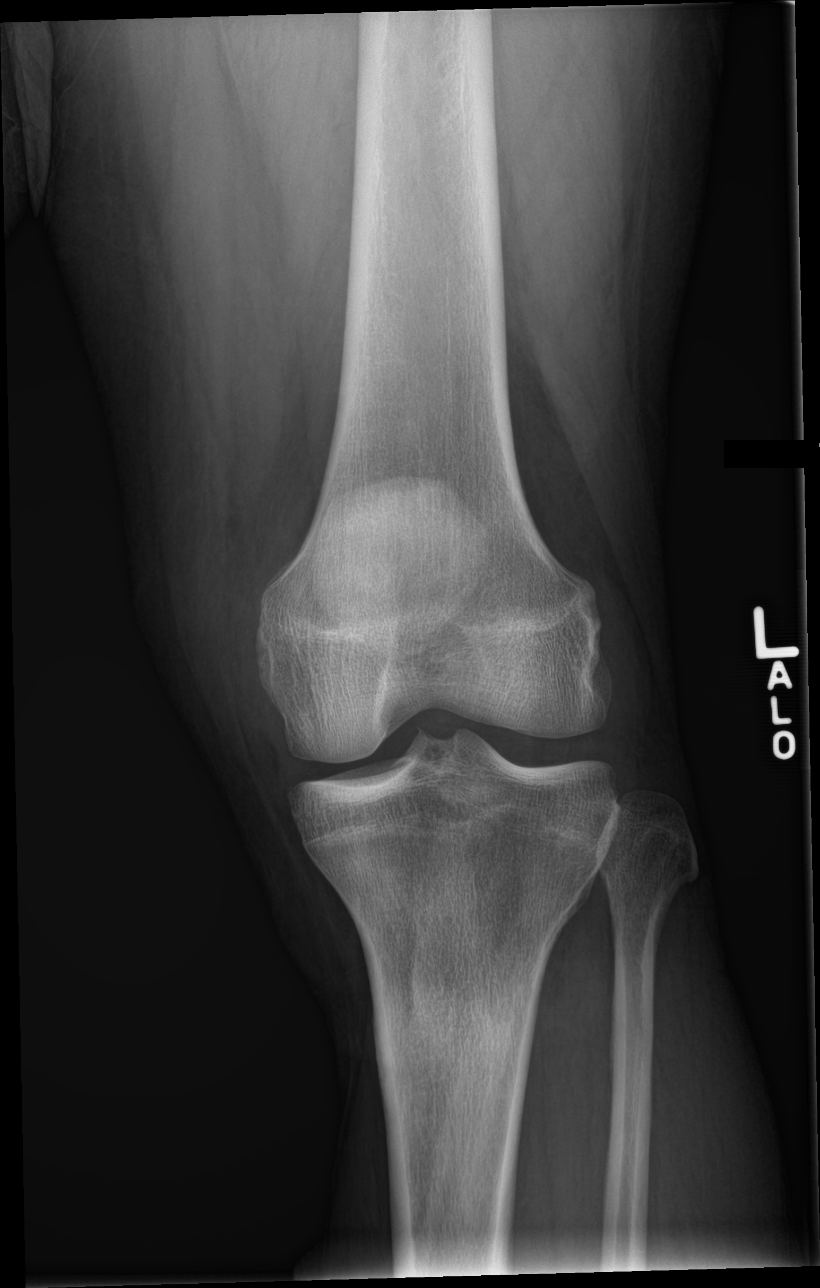

[knee lat]
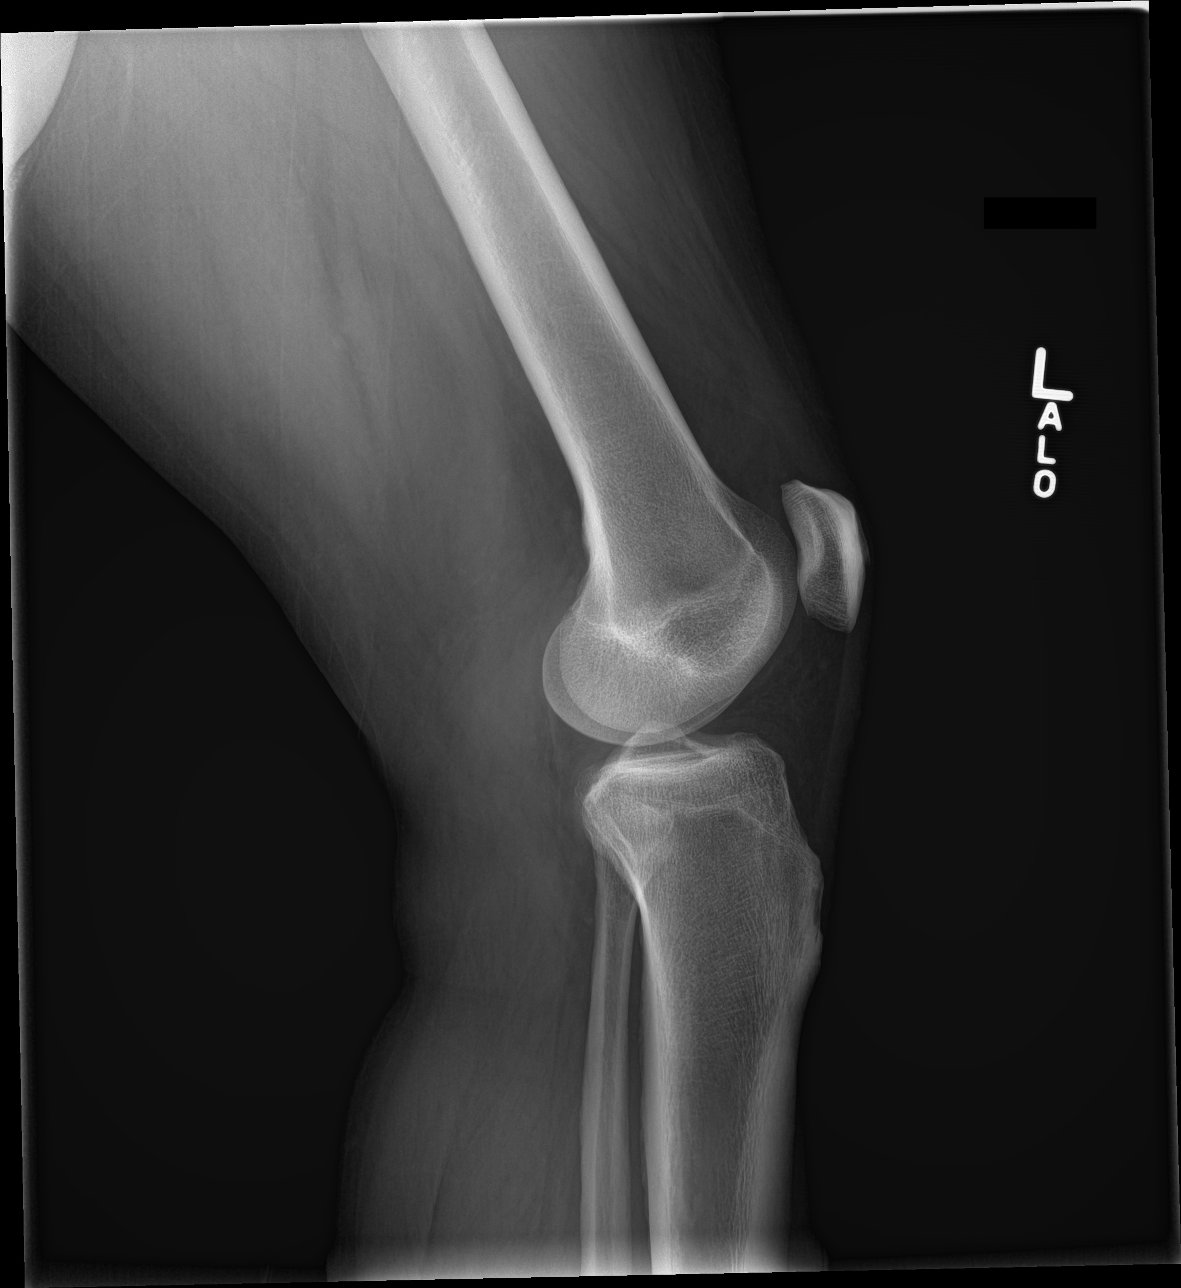

[tunnel]
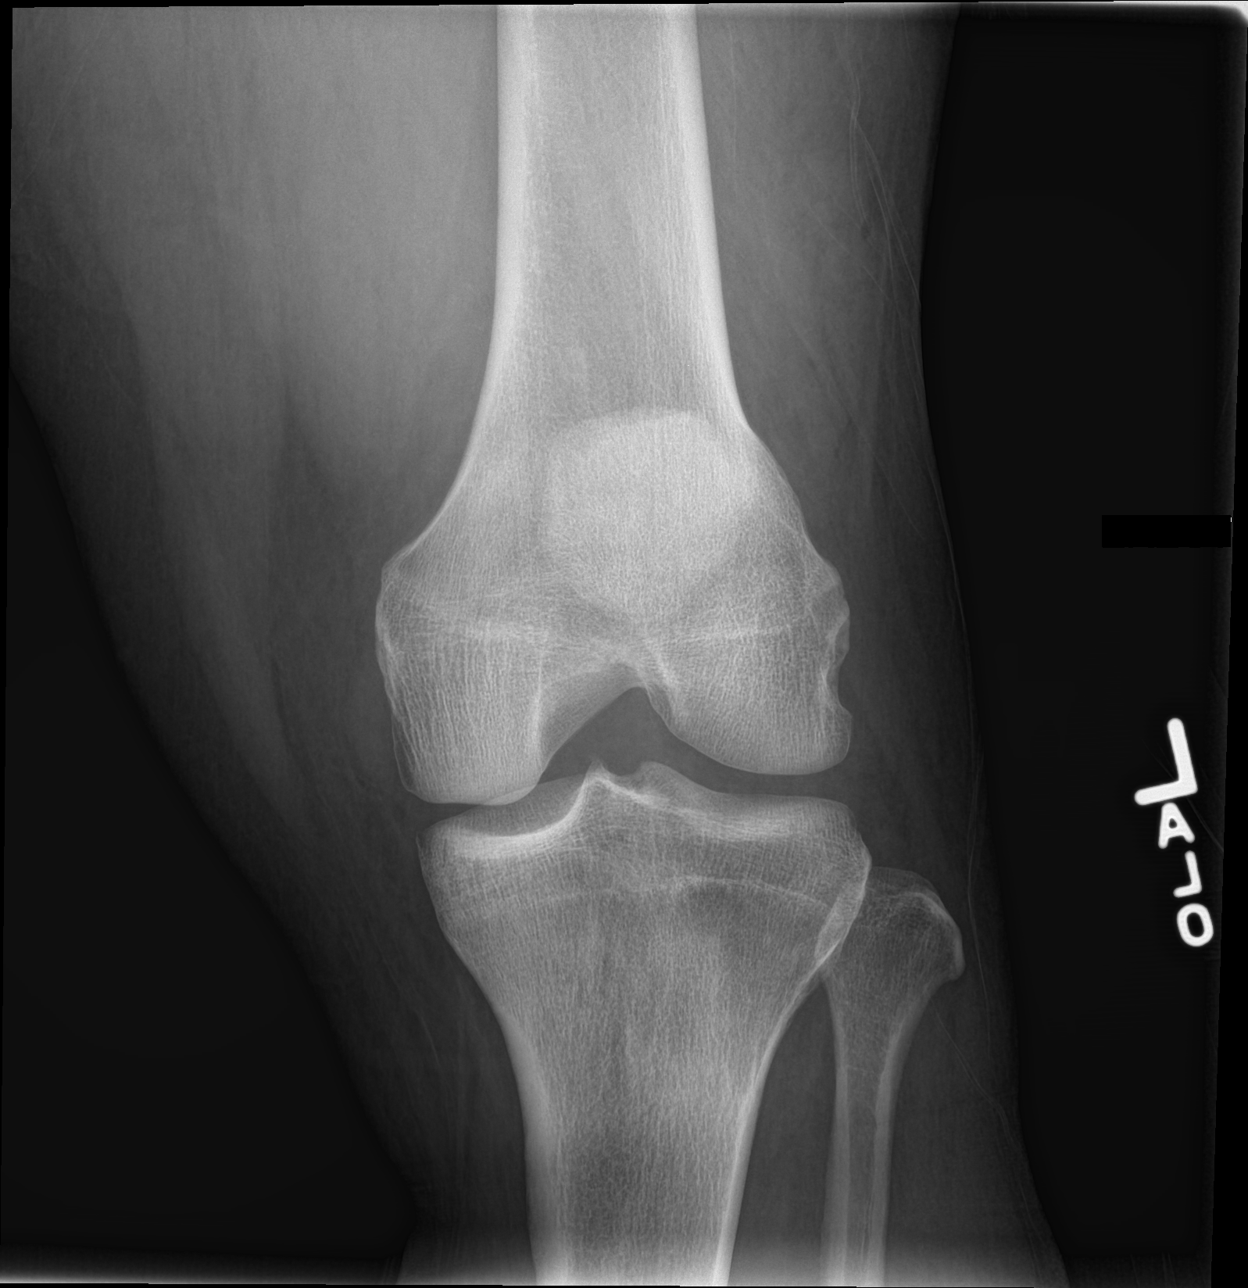

[patella skyline]
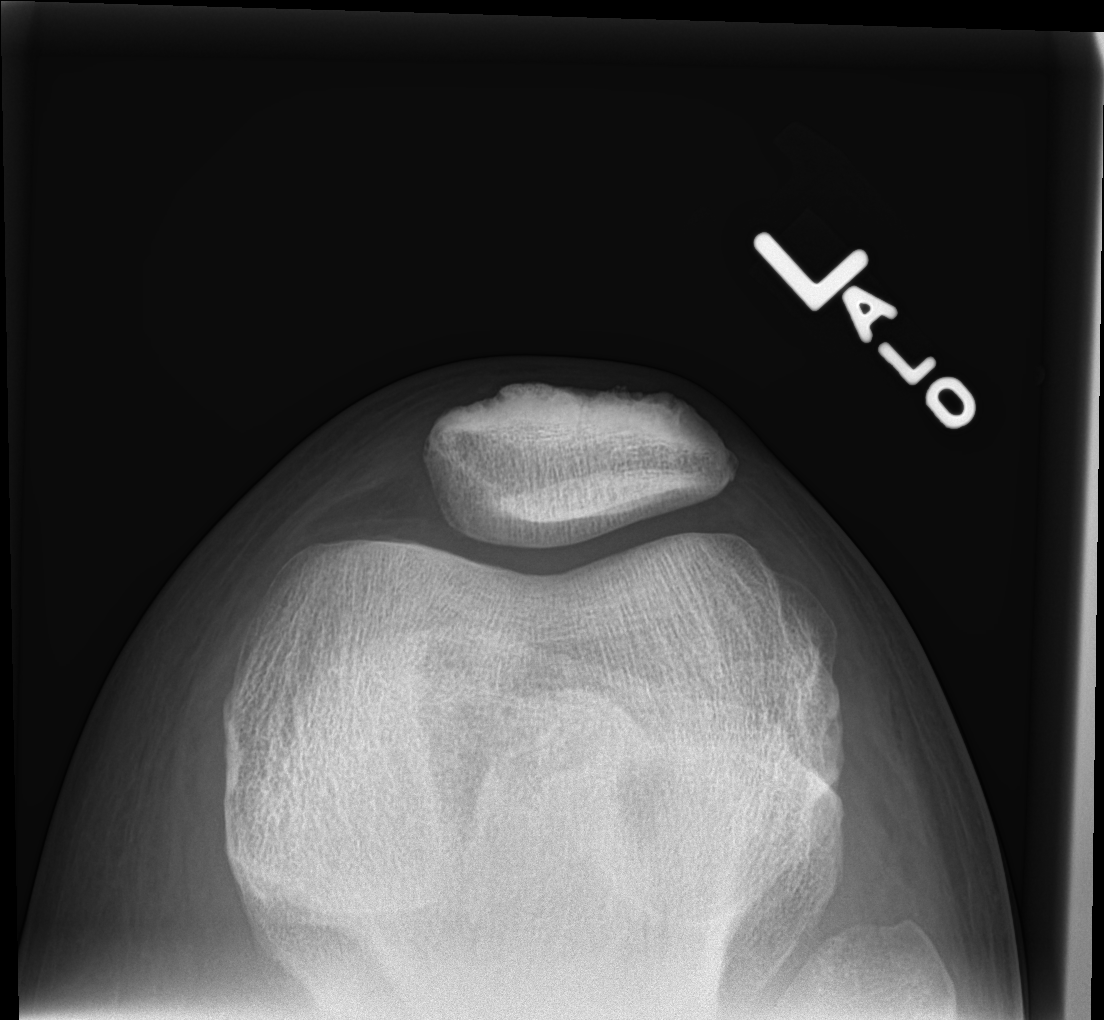

[4 of 4 positions shown; findings below may reference images not displayed]

FINDINGS: Normal alignment. No acute fracture. Normal mineralization. The soft
tissues are unremarkable. No joint effusion.
IMPRESSION: Normal knee radiograph. No acute findings or significant
arthropathy.

## 2024-04-25 ENCOUNTER — Ambulatory Visit
Admission: EM | Admit: 2024-04-25 | Discharge: 2024-04-25 | Disposition: A | Discharge status: Home / Self Care | Payer: Self-pay | Attending: Nurse Practitioner | Admitting: Nurse Practitioner

## 2024-04-25 ENCOUNTER — Ambulatory Visit: Payer: Self-pay

## 2024-04-25 ENCOUNTER — Ambulatory Visit (INDEPENDENT_AMBULATORY_CARE_PROVIDER_SITE_OTHER): Payer: Self-pay

## 2024-04-25 DIAGNOSIS — M25561 Pain in right knee: Secondary | ICD-10-CM

## 2024-04-25 DIAGNOSIS — E790 Hyperuricemia without signs of inflammatory arthritis and tophaceous disease: Secondary | ICD-10-CM | POA: Insufficient documentation

## 2024-04-25 DIAGNOSIS — M2550 Pain in unspecified joint: Secondary | ICD-10-CM | POA: Insufficient documentation

## 2024-04-25 DIAGNOSIS — M109 Gout, unspecified: Secondary | ICD-10-CM | POA: Insufficient documentation

## 2024-04-25 DIAGNOSIS — M25461 Effusion, right knee: Secondary | ICD-10-CM

## 2024-04-25 LAB — SEDIMENTATION RATE: Sed Rate: 30 mm/h — ABNORMAL HIGH (ref 0–16)

## 2024-04-25 LAB — URIC ACID: Uric Acid, Serum: 8.9 mg/dL — ABNORMAL HIGH (ref 3.7–8.6)

## 2024-04-25 LAB — C-REACTIVE PROTEIN: CRP: 7.7 mg/dL — ABNORMAL HIGH (ref <1.0)

## 2024-04-25 MED ORDER — PREDNISONE 10 MG (21) PO TBPK: ORAL_TABLET | Freq: Every day | ORAL | 0 refills | Status: DC

## 2024-04-25 MED ORDER — NAPROXEN 500 MG PO TABS: 500.0000 mg | ORAL_TABLET | Freq: Two times a day (BID) | ORAL | 0 refills | Status: DC

## 2024-04-25 MED ORDER — ALLOPURINOL 300 MG PO TABS: 300.0000 mg | ORAL_TABLET | Freq: Every day | ORAL | 0 refills | Status: AC

## 2024-04-25 NOTE — Telephone Encounter (Signed)
 Lab results show elevated levels of uric acid, C-reactive protein (CRP), and erythrocyte sedimentation rate (ESR), which support the clinical impression of a gout flare and underlying arthralgias. Gout is caused by a buildup of uric acid crystals in the joints, leading to sudden pain, swelling, and inflammation. Arthralgia refers to joint pain that may be due to inflammation or other causes and often coexists with conditions like gout. The patient should continue taking all prescribed medications and to follow supportive care measures as discussed in clinic. It is important that the patient follows up with a primary care provider and orthopedic specialist for long-term management and monitoring of symptoms.

## 2024-04-25 NOTE — ED Triage Notes (Signed)
 Patient states that he has swelling on right foot up to his knee. X 2 days. States that he feels some inflammation on his left foot.

## 2024-04-25 NOTE — Discharge Instructions (Addendum)
 You have been diagnosed with a gout flare affecting your right foot and knee. Your uric acid level today was elevated at 8.9, supporting the diagnosis. Inflammatory blood markers (ESR and CRP) were also obtained, and results are currently pending. An X-ray of the right knee was performed, and results are pending as well. You will be notified if any of these results are abnormal or if your treatment plan needs to be adjusted. All results will be available to view through your MyChart account.  You have been prescribed a prednisone  taper and naproxen to reduce inflammation and manage pain. Do not take additional NSAIDs such as ibuprofen  or aspirin while on naproxen, as this may increase the risk of side effects. Allopurinol  has been restarted to help prevent future flares. Drink plenty of water daily to help flush uric acid from your system and avoid foods high in purines such as red meat, shellfish, and alcohol, especially wine and beer. You may apply ice packs to swollen joints for 15-20 minutes at a time to reduce discomfort. Always place a towel between your skin and the ice pack to avoid skin damage. Continue using your compression socks and knee brace as needed for support.  It is important that you follow up with a primary care provider, preferably Nathaniel [name], to re-establish care and with Dr. Rebekah [name] for further evaluation of your knee. Call as soon as possible to schedule these appointments. Seek immediate medical attention if you develop worsening joint pain, increased redness or warmth over the joint, fever, chills, or drainage from the area--these may be signs of infection that require urgent care.

## 2024-04-25 NOTE — ED Provider Notes (Signed)
 [facility]    CSN: [id number] Arrival date & time: 04/25/24  1478      History   Chief Complaint Chief Complaint  Patient presents with   Leg Swelling    HPI Ishaq Maffei is a 48 y.o. male.   Subjective:  Georgie Haque is a 48 year old male with a history of chronic gout affecting multiple joints who presents with swelling of the right foot and right knee. He reports that the swelling began two days ago in the right foot, initially localized to the second toe, but progressed this morning to involve the entire foot. He also notes swelling in the right knee that began yesterday. In an effort to manage his symptoms, he soaked in Epsom salt on Monday and drank tart cherry juice yesterday; he observed that the knee swelling began after consuming the cherry juice. He reports mild tingling in the left foot without associated pain or swelling. Earlier today, he took five ibuprofen  tablets for pain relief. He also regularly wears compression socks and a knee brace to manage joint discomfort.  His medical history includes multiple visits for gout-related flares since 2020. Most recently, on May 4th, he was treated for a gout flare in the right elbow with a Decadron  injection in clinic and a steroid taper. In April 2023, he experienced a left knee effusion and underwent joint aspiration and injection by Dr. Rebekah Canada at [facility]. He established care with Aviva Lemmings, FNP, at [facility] in May 2023, with a plan for follow-up in three months, though he did not return and currently does not have a primary care provider. Laboratory data show a uric acid level of 10.0 in December 2021, with subsequent values in April and May 2023 within normal range. However, a sedimentation rate from May 2023 was elevated at 56, suggesting that his ongoing joint symptoms may be due to a combination of gout and underlying arthralgia.  The patient denies tobacco use and  reports occasional alcohol intake, typically limited to wine and champagne.  The following portions of the patient's history were reviewed and updated as appropriate: allergies, current medications, past family history, past medical history, past social history, past surgical history, and problem list.         Past Medical History:  Diagnosis Date   Asthma    Gout     Patient Active Problem List   Diagnosis Date Noted   Idiopathic chronic gout of multiple sites without tophus 03/14/2022   Effusion of left knee 02/28/2022   Chronic foot pain, left 02/28/2022    Past Surgical History:  Procedure Laterality Date   NO PAST SURGERIES         Home Medications    Prior to Admission medications   Medication Sig Start Date End Date Taking? Authorizing Provider  allopurinol  (ZYLOPRIM ) 300 MG tablet Take 1 tablet (300 mg total) by mouth daily. 04/25/24  Yes Maryruth Sol, FNP  naproxen (NAPROSYN) 500 MG tablet Take 1 tablet (500 mg total) by mouth 2 (two) times daily with a meal. 04/25/24  Yes Haston Casebolt, Ova Bloomer, FNP  predniSONE  (STERAPRED UNI-PAK 21 TAB) 10 MG (21) TBPK tablet Take by mouth daily. Take 6 tabs by mouth daily  for 2 days, then 5 tabs for 2 days, then 4 tabs for 2 days, then 3 tabs for 2 days, 2 tabs for 2 days, then 1 tab by mouth daily for 2 days 04/25/24  Yes Maryruth Sol, FNP  albuterol (  VENTOLIN HFA) 108 (90 Base) MCG/ACT inhaler Inhale into the lungs every 6 (six) hours as needed for wheezing or shortness of breath.    [provider]    Family History Family History  Problem Relation Age of Onset   Diabetes Mother    Heart disease Mother    COPD Mother     Social History Social History   Tobacco Use   Smoking status: Some Days    Types: Cigars   Smokeless tobacco: Never  Vaping Use   Vaping status: Never Used  Substance Use Topics   Alcohol use: Yes    Comment: rare   Drug use: Never     Allergies   Patient has no known  allergies.   Review of Systems Review of Systems  Constitutional:  Negative for fever.  Musculoskeletal:  Positive for arthralgias and joint swelling. Negative for gait problem and myalgias.  Skin:  Negative for rash.  Neurological:  Negative for weakness and numbness.  All other systems reviewed and are negative.    Physical Exam Triage Vital Signs ED Triage Vitals  Encounter Vitals Group     BP 04/25/24 0831 113/77     Girls Systolic BP Percentile --      Girls Diastolic BP Percentile --      Boys Systolic BP Percentile --      Boys Diastolic BP Percentile --      Pulse Rate 04/25/24 0831 85     Resp 04/25/24 0831 16     Temp 04/25/24 0831 98.3 F (36.8 C)     Temp Source 04/25/24 0831 Oral     SpO2 04/25/24 0831 98 %     Weight --      Height --      Head Circumference --      Peak Flow --      Pain Score 04/25/24 0830 0     Pain Loc --      Pain Education --      Exclude from Growth Chart --    No data found.  Updated Vital Signs BP 113/77 (BP Location: Left Arm)   Pulse 85   Temp 98.3 F (36.8 C) (Oral)   Resp 16   SpO2 98%   Visual Acuity Right Eye Distance:   Left Eye Distance:   Bilateral Distance:    Right Eye Near:   Left Eye Near:    Bilateral Near:     Physical Exam Vitals and nursing note reviewed.  Constitutional:      General: He is awake. He is not in acute distress.    Appearance: Normal appearance. He is well-developed. He is not ill-appearing or toxic-appearing.  HENT:     Head: Normocephalic.     Mouth/Throat:     Mouth: Mucous membranes are moist.   Eyes:     Conjunctiva/sclera: Conjunctivae normal.    Cardiovascular:     Rate and Rhythm: Normal rate and regular rhythm.     Heart sounds: Normal heart sounds.  Pulmonary:     Effort: Pulmonary effort is normal.     Breath sounds: Normal breath sounds.   Musculoskeletal:        General: Normal range of motion.     Cervical back: Full passive range of motion without pain,  normal range of motion and neck supple.     Right knee: Swelling present. No erythema or crepitus. Normal range of motion. Tenderness present. Normal alignment, normal meniscus and normal patellar mobility.  Right foot: Normal range of motion.     Left foot: Normal range of motion.       Feet:  Feet:     Right foot:     Skin integrity: Skin integrity normal.     Comments: Right foot reveals swelling of the second toe without erythema. Non-pitting swelling is noted throughout the right foot. Range of motion is preserved, and pedal pulses are intact bilaterally. Skin integrity of the right foot is normal. The right great toenail appears thickened but without evidence of fungal infection. Examination of the left foot is unremarkable.  Skin:    General: Skin is warm and dry.   Neurological:     General: No focal deficit present.     Mental Status: He is alert and oriented to person, place, and time.   Psychiatric:        Behavior: Behavior is cooperative.      UC Treatments / Results  Labs (all labs ordered are listed, but only abnormal results are displayed) Labs Reviewed  URIC ACID - Abnormal; Notable for the following components:      Result Value   Uric Acid, Serum 8.9 (*)    All other components within normal limits  SEDIMENTATION RATE  C-REACTIVE PROTEIN    EKG   Radiology No results found.  Procedures Procedures (including critical care time)  Medications Ordered in UC Medications - No data to display  Initial Impression / Assessment and Plan / UC Course  I have reviewed the triage vital signs and the nursing notes.  Pertinent labs & imaging results that were available during my care of the patient were reviewed by me and considered in my medical decision making (see chart for details).     Patient presents with a two-day history of right foot swelling that has progressed to involve the entire foot, along with right knee swelling that began yesterday. He  has a known history of chronic gout with prior flares involving the elbow, ankle, and right foot. Although uric acid levels were normal in May and April 2023, they were previously elevated in December 2021. The patient was on allopurinol  in the past but is not currently taking it. He has attempted self-care with Epsom salt soaks and tart cherry juice, and took ibuprofen  today for symptom relief. Given the clinical presentation and history, this is consistent with an acute gout flare involving multiple joints. An X-ray of the right knee will be obtained, and labs including uric acid, ESR, and CRP will be ordered. Treatment includes a prednisone  taper and naproxen BID for 10 days, with instructions to avoid products containing aspirin and refrain from using OTC NSAIDs while taking naproxen. Allopurinol  will be restarted for long-term management. The patient was counseled on dietary modifications and the importance of increased water intake. Follow-up with [facility] to re-establish primary care and with Dr. Rebekah Canada for ongoing knee issues is recommended. ED precautions were reviewed.  Today's evaluation has revealed no signs of a dangerous process. Discussed diagnosis with patient and/or guardian. Patient and/or guardian aware of their diagnosis, possible red flag symptoms to watch out for and need for close follow up. Patient and/or guardian understands verbal and written discharge instructions. Patient and/or guardian comfortable with plan and disposition.  Patient and/or guardian has a clear mental status at this time, good insight into illness (after discussion and teaching) and has clear judgment to make decisions regarding their care  Documentation was completed with the aid of voice recognition  software. Transcription may contain typographical errors. Final Clinical Impressions(s) / UC Diagnoses   Final diagnoses:  Pain and swelling of right knee  Acute gout of multiple sites, unspecified  cause  Polyarthralgia  Hyperuricemia     Discharge Instructions      You have been diagnosed with a gout flare affecting your right foot and knee. Your uric acid level today was elevated at 8.9, supporting the diagnosis. Inflammatory blood markers (ESR and CRP) were also obtained, and results are currently pending. An X-ray of the right knee was performed, and results are pending as well. You will be notified if any of these results are abnormal or if your treatment plan needs to be adjusted. All results will be available to view through your MyChart account.  You have been prescribed a prednisone  taper and naproxen to reduce inflammation and manage pain. Do not take additional NSAIDs such as ibuprofen  or aspirin while on naproxen, as this may increase the risk of side effects. Allopurinol  has been restarted to help prevent future flares. Drink plenty of water daily to help flush uric acid from your system and avoid foods high in purines such as red meat, shellfish, and alcohol, especially wine and beer. You may apply ice packs to swollen joints for 15-20 minutes at a time to reduce discomfort. Always place a towel between your skin and the ice pack to avoid skin damage. Continue using your compression socks and knee brace as needed for support.  It is important that you follow up with a primary care provider, preferably Glenda Fields, to re-establish care and with Dr. Rebekah Canada for further evaluation of your knee. Call as soon as possible to schedule these appointments. Seek immediate medical attention if you develop worsening joint pain, increased redness or warmth over the joint, fever, chills, or drainage from the area--these may be signs of infection that require urgent care.      ED Prescriptions     Medication Sig Dispense Auth. Provider   naproxen (NAPROSYN) 500 MG tablet Take 1 tablet (500 mg total) by mouth 2 (two) times daily with a meal. 20 tablet Kannen Moxey, FNP    predniSONE  (STERAPRED UNI-PAK 21 TAB) 10 MG (21) TBPK tablet Take by mouth daily. Take 6 tabs by mouth daily  for 2 days, then 5 tabs for 2 days, then 4 tabs for 2 days, then 3 tabs for 2 days, 2 tabs for 2 days, then 1 tab by mouth daily for 2 days 42 tablet Jori Frerichs, [place], FNP   allopurinol  (ZYLOPRIM ) 300 MG tablet Take 1 tablet (300 mg total) by mouth daily. 30 tablet Maryruth Sol, FNP      PDMP not reviewed this encounter.   Beola Brazil [place], [place] 04/25/24 [phone]

## 2024-09-16 ENCOUNTER — Ambulatory Visit
Admission: EM | Admit: 2024-09-16 | Discharge: 2024-09-16 | Disposition: A | Discharge status: Home / Self Care | Payer: Self-pay | Attending: Emergency Medicine | Admitting: Emergency Medicine

## 2024-09-16 DIAGNOSIS — M109 Gout, unspecified: Secondary | ICD-10-CM

## 2024-09-16 MED ORDER — COLCHICINE 0.6 MG PO TABS: ORAL_TABLET | ORAL | 0 refills | Status: AC

## 2024-09-16 MED ORDER — PREDNISONE 20 MG PO TABS: 40.0000 mg | ORAL_TABLET | Freq: Every day | ORAL | 0 refills | Status: AC

## 2024-09-16 NOTE — ED Provider Notes (Signed)
 HPI  SUBJECTIVE:  Nathaniel [name] is a 48 y.o. male who presents with recurrent pain described as soreness in his right midfoot accompanied with swelling along the dorsum of his foot starting yesterday.  He reports hypersensitivity.  He states that he recently ate some pizza and drink some wine which are known triggers for him.  He is concerned that he is about to have another gout flare and wants to nip this in the bud.  No erythema or swelling of the MTP or ankle, fevers, trauma to the foot, change in his physical activity.  Denies any possible sprain or injury to the foot.  No recent change in his medication.  He has tried compression socks, copper compression sock/brace, elevation and ibuprofen  200 to 400 mg.  Elevation helps.  Compression socks, walking and hanging his foot in the dependent position make it worse.  Patient has a past medical history of asthma and gout in multiple joints with an elevated uric acid at 11 several years ago. He has been seen multiple times here in the urgent care for this, last urgent care evaluation was in June 2025.  Uric acid at that time was 8.9, he also had an elevated CRP, ESR.    No history of chronic kidney disease, rheumatoid arthritis, pretension, diabetes, right foot injury.  PCP: None.  He was evaluated by [place] in April 2023, then by the [facility] internal medicine clinic in May 2023.  He has tried steroids, meloxicam , colchicine , tart cherry juice, allopurinol , and was eventually referred to podiatry.  He was seen by them on June 2023, placed on Naprosyn  and a Medrol  Dosepak, and was instructed to follow-up as needed.    Past Medical History:  Diagnosis Date   Asthma    Gout     Past Surgical History:  Procedure Laterality Date   NO PAST SURGERIES      Family History  Problem Relation Age of Onset   Diabetes Mother    Heart disease Mother    COPD Mother     Social History   Tobacco Use   Smoking status: Some Days     Types: Cigars   Smokeless tobacco: Never  Vaping Use   Vaping status: Never Used  Substance Use Topics   Alcohol use: Yes    Comment: rare   Drug use: Never    No current facility-administered medications for this encounter.  Current Outpatient Medications:    colchicine  0.6 MG tablet, 2 tabs po x 1, then one tab po 1 hour later.  1 tablet 1-2 times a day for 4 more days or until flare resolves., Disp: 15 tablet, Rfl: 0   predniSONE  (DELTASONE ) 20 MG tablet, Take 2 tablets (40 mg total) by mouth daily with breakfast for 5 days., Disp: 10 tablet, Rfl: 0   albuterol (VENTOLIN HFA) 108 (90 Base) MCG/ACT inhaler, Inhale into the lungs every 6 (six) hours as needed for wheezing or shortness of breath., Disp: , Rfl:    [Paused] allopurinol  (ZYLOPRIM ) 300 MG tablet, Take 1 tablet (300 mg total) by mouth daily., Disp: 30 tablet, Rfl: 0  No Known Allergies   ROS  As noted in HPI.   Physical Exam  BP (!) 134/96 (BP Location: Left Arm)   Pulse 97   Temp 98.7 F (37.1 C) (Oral)   Resp 18   Wt 95.8 kg   SpO2 100%   BMI 34.09 kg/m   Constitutional: Well developed, well nourished, no acute distress  Eyes:  EOMI, conjunctiva normal bilaterally HENT: Normocephalic, atraumatic,mucus membranes moist Respiratory: Normal inspiratory effort Cardiovascular: Normal rate GI: nondistended skin: No rash, skin intact Musculoskeletal: no deformities Right midfoot tender.  No erythema, swelling.  Mild tenderness at the first MTP joint.  No erythema, swelling.  Base of fifth metatarsal NT.  Calcaneus NT. no bruising. Skin intact. DP 2+. Sensation grossly intact. Patient able to move all toes actively.  Pain with dorsiflexion.   no pain with inversion / eversion,   plantarflexion. No Tenderness along the plantar fascia. Distal fibula NT, Medial malleolus NT,  Deltoid ligament NT, Lateral ligaments NT, Achilles NT. Patient able to bear weight while in department.  Neurologic: Alert & oriented x 3, no  focal neuro deficits Psychiatric: Speech and behavior appropriate   ED Course   Medications - No data to display  Orders Placed This Encounter  Procedures   Basic metabolic panel    Standing Status:   Standing    Number of Occurrences:   1    No results found for this or any previous visit (from the past 24 hours). No results found.  ED Clinical Impression  1. Acute gout of right foot, unspecified cause      ED Assessment/Plan    Previous records, outside records and labs reviewed.  As noted in HPI.  Last CMP was in 2023  This is an acute exacerbation of a chronic problem.    Presentation consistent with recurrent gout flare.  No evidence of septic joint.  Doubt fracture, dislocation, sprain.  Will check basic metabolic panel today to rule out renal insufficiency as cause of gout flare, but I suspect that his recent dietary indiscretions with pizza and wine are the cause.  Will send home with prednisone  40 mg for 5 days, ibuprofen  600 mg to take with 1000 mg of Tylenol  3-4 times a day.  States he does not need a prescription of ibuprofen .  He states colchicine  has worked for him in the past, so we will send him home with a prescription for that as well.  He has tried tart cherry juice.  Will provide primary care list for routine care.  Advise close follow-up with primary care and emphasized importance of establishing care.  Also recommended follow-up with orthopedics or podiatry since this is an ongoing issue.  Discussed labs,  MDM, treatment plan, and plan for follow-up with patient. Discussed sn/sx that should prompt return to the ED. patient agrees with plan.   Meds ordered this encounter  Medications   colchicine  0.6 MG tablet    Sig: 2 tabs po x 1, then one tab po 1 hour later.  1 tablet 1-2 times a day for 4 more days or until flare resolves.    Dispense:  15 tablet    Refill:  0   predniSONE  (DELTASONE ) 20 MG tablet    Sig: Take 2 tablets (40 mg total) by mouth daily  with breakfast for 5 days.    Dispense:  10 tablet    Refill:  0      *This clinic note was created using Scientist, clinical (histocompatibility and immunogenetics). Therefore, there may be occasional mistakes despite careful proofreading.  ?    Van Knee, MD 09/16/24 1952

## 2024-09-16 NOTE — Discharge Instructions (Addendum)
 Take 600 mg of ibuprofen  with 1000 mg of Tylenol  3-4 times a day as needed for pain.  The ibuprofen  will help resolve gout flare in addition to treating pain and inflammation in the Tylenol  will help with the pain.  Take the colchicine  and prednisone  as directed.  You can also try tart cherry juice.  We will contact you if we need to make any changes based on your labs.  It is important to establish care with a primary care provider provider for monitoring of your blood pressure and for routine/preventative care.  I would recommend follow-up with podiatry or orthopedics as this is a recurrent issue  Here is a list of primary care providers who are taking new patients:  [facility] [place] Dr. Selinda Ku (sports medicine) Dr. Harlene Duos Rolan Hoyle, PA Dr. Mackey Ny Dr. Harlene Saddler [address] [place] [address] [phone]  [facility] [address] [place], [place] [address] [phone]  [facility] [place] [address] [place] [address]  [phone]  [facility] [address] [place], [place] [address] [phone]  [facility] [address] [place]  [phone] [place], [place] [address]  Here are clinics/ other resources who will see you if you do not have insurance. Some have certain criteria that you must meet. Call them and find out what they are:  [facility]: [address] [place], [place] [address] Phone: [phone] Hours: First and Third Saturdays of each Month, 9 a.m. - 1 p.m.  [facility]: [address], [address] [place], [place] [address] Phone: [phone] Hours: Tuesday, 4 p.m. - 8 p.m. Thursday, 1 p.m. - 8 p.m. Wednesday, 9 a.m. - [facility] [address] [place], [place] [address] Phone: [phone] Pharmacy Phone Number: [phone] Dental Phone Number: [phone] [facility] Insurance Help: [phone]  Dental Hours: Monday -  Thursday, 8 a.m. - 6 p.m.  [facility] [address] [place], [place] [address] Phone: [phone] Pharmacy Phone Number: [phone] [facility] Insurance Help: [phone]  [facility] [address] [place] [place], [place] [address] Phone: [phone] Pharmacy Phone Number: [phone] [facility] Insurance Help: [phone]  [facility] [address] [place], [place] [address] Phone: [phone] [facility] Insurance Help: [phone]   [facility] [address], [place], [place] [address] Phone: [phone]  Go to www.goodrx.com  or www.costplusdrugs.com to look up your medications. This will give you a list of where you can find your prescriptions at the most affordable prices. Or ask the pharmacist what the cash price is, or if they have any other discount programs available to help make your medication more affordable. This can be less expensive than what you would pay with insurance.

## 2024-09-16 NOTE — ED Triage Notes (Signed)
 Pt c/o R foot pain & swelling x1 day. Hx of gout. Has tried IBU w/o relief.

## 2024-09-17 ENCOUNTER — Telehealth: Payer: Self-pay | Admitting: Emergency Medicine

## 2024-09-17 NOTE — Telephone Encounter (Signed)
 BMP specimen accidentally left out overnight.  Will need to redraw another 1.  Will have staff contact patient and have him come in for blood draw.  This does not need to be a provider or nurse visit.

## 2024-09-17 NOTE — Telephone Encounter (Signed)
 Called pt in regards to his BMP specimen. Stated he would not be able to return d/t him not living in the area.

## 2024-12-19 ENCOUNTER — Ambulatory Visit
Admission: EM | Admit: 2024-12-19 | Discharge: 2024-12-19 | Disposition: A | Discharge status: Home / Self Care | Payer: Self-pay | Source: Home / Self Care

## 2024-12-19 DIAGNOSIS — M109 Gout, unspecified: Secondary | ICD-10-CM

## 2024-12-19 DIAGNOSIS — Z758 Other problems related to medical facilities and other health care: Secondary | ICD-10-CM

## 2024-12-19 MED ORDER — PREDNISONE 10 MG (21) PO TBPK: ORAL_TABLET | ORAL | 0 refills | Status: AC

## 2024-12-19 NOTE — ED Triage Notes (Signed)
 Right foot swelling onset 2 days ago. Denies any known injuries. Patient does have a history of gout.  Patient tried ibuprofen  with no relief.

## 2024-12-19 NOTE — ED Provider Notes (Signed)
 " [facility]    CSN: [id number] Arrival date & time: 12/19/24  9075      History   Chief Complaint Chief Complaint  Patient presents with   Foot Swelling    HPI Nathaniel [name] is a 49 y.o. male.   49 year old male pt, Nathaniel [name], presents to urgent care for evaluation of right foot swelling for 2 days. Pt has hx of gout, last treated 01/25 for gout of elbow. Pt has used steroids,tart cherry juice, OTC meds and colchicine  in past. Pt reports he eats pizza and drinks wine.   Ddx: Gout,asthma  Last CMP was 09/2022, no PCP  The history is provided by the patient. No language interpreter was used.    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 Authorizing Provider  predniSONE  (STERAPRED UNI-PAK 21 TAB) 10 MG (21) TBPK tablet Take as package directed 12/19/24  Yes Kitana Gage, NP  albuterol (VENTOLIN HFA) 108 (90 Base) MCG/ACT inhaler Inhale into the lungs every 6 (six) hours as needed for wheezing or shortness of breath.    [provider]  allopurinol  (ZYLOPRIM ) 300 MG tablet Take 1 tablet (300 mg total) by mouth daily. 04/25/24   Iola Lukes, FNP  colchicine  0.6 MG tablet 2 tabs po x 1, then one tab po 1 hour later.  1 tablet 1-2 times a day for 4 more days or until flare resolves. 09/16/24   Van Knee, MD    Family History Family History  Problem Relation Age of Onset   Diabetes Mother    Heart disease Mother    COPD Mother     Social History Social  History[1]   Allergies   Patient has no known allergies.   Review of Systems Review of Systems  Musculoskeletal:  Positive for arthralgias, gait problem and joint swelling.  Skin: Negative.   All other systems reviewed and are negative.    Physical Exam Triage Vital Signs ED Triage Vitals  Encounter Vitals Group     BP      Girls Systolic BP Percentile      Girls Diastolic BP Percentile      Boys Systolic BP Percentile      Boys Diastolic BP Percentile      Pulse      Resp      Temp      Temp src      SpO2      Weight      Height      Head Circumference      Peak Flow      Pain Score      Pain Loc      Pain Education      Exclude from Growth Chart    No data found.  Updated Vital Signs BP 130/89 (BP Location: Right Arm)   Pulse 94   Temp 98.6 F (37 C) (Oral)  Resp 16   SpO2 98%   Visual Acuity Right Eye Distance:   Left Eye Distance:   Bilateral Distance:    Right Eye Near:   Left Eye Near:    Bilateral Near:     Physical Exam Vitals and nursing note reviewed.  Cardiovascular:     Rate and Rhythm: Normal rate and regular rhythm.     Pulses: Normal pulses.          Dorsalis pedis pulses are 2+ on the right side.     Heart sounds: Normal heart sounds.  Pulmonary:     Effort: Pulmonary effort is normal.  Feet:     Right foot:     Toenail Condition: Right toenails are abnormally thick. Fungal disease present.    Comments: Pt has swelling to right foot c/o midfoot pain/swelling,skin intact no rash Neurological:     General: No focal deficit present.     Mental Status: He is alert and oriented to person, place, and time.     GCS: GCS eye subscore is 4. GCS verbal subscore is 5. GCS motor subscore is 6.     Cranial Nerves: No cranial nerve deficit.     Sensory: Sensation is intact.     Motor: Motor function is intact.     Coordination: Coordination is intact.  Psychiatric:        Attention and Perception: Attention normal.        Mood and  Affect: Mood normal.        Speech: Speech normal.        Behavior: Behavior normal. Behavior is cooperative.      UC Treatments / Results  Labs (all labs ordered are listed, but only abnormal results are displayed) Labs Reviewed - No data to display  EKG   Radiology No results found.  Procedures Procedures (including critical care time)  Medications Ordered in UC Medications - No data to display  Initial Impression / Assessment and Plan / UC Course  I have reviewed the triage vital signs and the nursing notes.  Pertinent labs & imaging results that were available during my care of the patient were reviewed by me and considered in my medical decision making (see chart for details).    Discussed exam findings and plan of care with patient :we are treating you for acute flare of gout. Take prednisone  as prescribed, may take over the counter tylenol  as label directed for pain. May use lidocaine or biofreeze topically as well for pain as label directed. Please avoid alcohol, red meats, we have provided you a handout of foods that are bad for gout and should be avoided, also discussed foods that will help reduce flare ups(fruits,veggies,drink plenty of water).  Go to ER precautions given , patient verbalized understanding to this provider.  Ddx: Acute gout flare, right foot pain Final Clinical Impressions(s) / UC Diagnoses   Final diagnoses:  Acute gout of right foot, unspecified cause  Does not have primary care provider     Discharge Instructions      We are treating you for acute flare of gout. Take prednisone  as prescribed, may take over the counter tylenol  as label directed for pain. May use lidocaine or biofreeze topically as well for pain as label directed. Please avoid alcohol, red meats, we have provided you a handout of foods that are bad for gout and should be avoided, also discussed foods that will help reduce flare ups(fruits,veggies,drink plenty of  water)  ED Prescriptions     Medication Sig Dispense Auth. Provider   predniSONE  (STERAPRED UNI-PAK 21 TAB) 10 MG (21) TBPK tablet Take as package directed 21 tablet Carlia Bomkamp, Rilla, NP      PDMP not reviewed this encounter.     [1]  Social History Tobacco Use   Smoking status: Some Days    Types: Cigars   Smokeless tobacco: Never  Vaping Use   Vaping status: Never Used  Substance Use Topics   Alcohol use: Yes    Comment: rare   Drug use: Never     Shakenna Herrero, Rilla, NP 12/19/24 1027  "

## 2024-12-19 NOTE — Discharge Instructions (Addendum)
 We are treating you for acute flare of gout. Take prednisone  as prescribed, may take over the counter tylenol  as label directed for pain. May use lidocaine or biofreeze topically as well for pain as label directed. Please avoid alcohol, red meats, we have provided you a handout of foods that are bad for gout and should be avoided, also discussed foods that will help reduce flare ups(fruits,veggies,drink plenty of water)

## 2025-01-21 ENCOUNTER — Ambulatory Visit: Payer: Self-pay | Admitting: Family Medicine
